# Patient Record
Sex: Male | Born: 1980 | Race: White | Hispanic: No | Marital: Married | Smoking: Current every day smoker
Health system: Southern US, Community
[De-identification: ages and names within clinical notes are randomized; demographics above are authoritative.]

## PROBLEM LIST (undated history)

## (undated) ENCOUNTER — Emergency Department (HOSPITAL_BASED_OUTPATIENT_CLINIC_OR_DEPARTMENT_OTHER): Admission: EM | Payer: Self-pay | Source: Home / Self Care

## (undated) DIAGNOSIS — F1920 Other psychoactive substance dependence, uncomplicated: Secondary | ICD-10-CM

## (undated) DIAGNOSIS — F410 Panic disorder [episodic paroxysmal anxiety] without agoraphobia: Secondary | ICD-10-CM

## (undated) DIAGNOSIS — F419 Anxiety disorder, unspecified: Secondary | ICD-10-CM

## (undated) DIAGNOSIS — M549 Dorsalgia, unspecified: Secondary | ICD-10-CM

## (undated) DIAGNOSIS — G8929 Other chronic pain: Secondary | ICD-10-CM

## (undated) HISTORY — PX: APPENDECTOMY: SHX54

---

## 1999-12-21 ENCOUNTER — Emergency Department (HOSPITAL_COMMUNITY): Admission: EM | Admit: 1999-12-21 | Discharge: 1999-12-21 | Payer: Self-pay | Admitting: Emergency Medicine

## 1999-12-21 ENCOUNTER — Encounter: Payer: Self-pay | Admitting: Emergency Medicine

## 1999-12-29 ENCOUNTER — Emergency Department (HOSPITAL_COMMUNITY): Admission: EM | Admit: 1999-12-29 | Discharge: 1999-12-29 | Payer: Self-pay | Admitting: Emergency Medicine

## 2003-07-08 ENCOUNTER — Emergency Department (HOSPITAL_COMMUNITY): Admission: AD | Admit: 2003-07-08 | Discharge: 2003-07-08 | Payer: Self-pay | Admitting: Family Medicine

## 2005-09-06 ENCOUNTER — Emergency Department (HOSPITAL_COMMUNITY): Admission: EM | Admit: 2005-09-06 | Discharge: 2005-09-07 | Payer: Self-pay | Admitting: Emergency Medicine

## 2008-10-14 ENCOUNTER — Emergency Department (HOSPITAL_COMMUNITY): Admission: EM | Admit: 2008-10-14 | Discharge: 2008-10-14 | Payer: Self-pay | Admitting: Emergency Medicine

## 2009-07-30 ENCOUNTER — Inpatient Hospital Stay (HOSPITAL_COMMUNITY): Admission: EM | Admit: 2009-07-30 | Discharge: 2009-08-12 | Payer: Self-pay | Admitting: Emergency Medicine

## 2009-08-19 ENCOUNTER — Encounter: Admission: RE | Admit: 2009-08-19 | Discharge: 2009-08-19 | Payer: Self-pay | Admitting: Surgery

## 2009-08-31 ENCOUNTER — Ambulatory Visit (HOSPITAL_COMMUNITY): Admission: RE | Admit: 2009-08-31 | Discharge: 2009-08-31 | Payer: Self-pay | Admitting: Surgery

## 2009-08-31 ENCOUNTER — Inpatient Hospital Stay (HOSPITAL_COMMUNITY): Admission: AD | Admit: 2009-08-31 | Discharge: 2009-09-07 | Payer: Self-pay | Admitting: Surgery

## 2009-09-21 ENCOUNTER — Inpatient Hospital Stay (HOSPITAL_COMMUNITY): Admission: RE | Admit: 2009-09-21 | Discharge: 2009-09-25 | Payer: Self-pay | Admitting: Surgery

## 2009-10-05 ENCOUNTER — Encounter: Admission: RE | Admit: 2009-10-05 | Discharge: 2009-10-05 | Payer: Self-pay | Admitting: Surgery

## 2009-10-26 ENCOUNTER — Encounter: Admission: RE | Admit: 2009-10-26 | Discharge: 2009-10-26 | Payer: Self-pay | Admitting: Surgery

## 2010-01-05 ENCOUNTER — Emergency Department (HOSPITAL_COMMUNITY): Admission: EM | Admit: 2010-01-05 | Discharge: 2010-01-06 | Payer: Self-pay | Admitting: Emergency Medicine

## 2010-07-20 LAB — CBC
MCH: 30.8 pg (ref 26.0–34.0)
MCHC: 34.3 g/dL (ref 30.0–36.0)
MCV: 89.7 fL (ref 78.0–100.0)
WBC: 9.3 10*3/uL (ref 4.0–10.5)

## 2010-07-20 LAB — BASIC METABOLIC PANEL
BUN: 14 mg/dL (ref 6–23)
Calcium: 9 mg/dL (ref 8.4–10.5)
Chloride: 106 mEq/L (ref 96–112)
Creatinine, Ser: 0.88 mg/dL (ref 0.4–1.5)
GFR calc non Af Amer: >60 mL/min (ref >60)
Glucose, Bld: 94 mg/dL (ref 70–99)

## 2010-07-20 LAB — DIFFERENTIAL
Basophils Absolute: 0.1 10*3/uL (ref 0.0–0.1)
Basophils Relative: 1 % (ref 0–1)
Eosinophils Absolute: 0.5 10*3/uL (ref 0.0–0.7)
Neutro Abs: 5.6 10*3/uL (ref 1.7–7.7)

## 2010-07-24 LAB — COMPREHENSIVE METABOLIC PANEL
AST: 16 U/L (ref 0–37)
AST: 17 U/L (ref 0–37)
Albumin: 3.1 g/dL — ABNORMAL LOW (ref 3.5–5.2)
Albumin: 3.6 g/dL (ref 3.5–5.2)
Alkaline Phosphatase: 54 U/L (ref 39–117)
BUN: 10 mg/dL (ref 6–23)
CO2: 29 mEq/L (ref 19–32)
CO2: 29 mEq/L (ref 19–32)
Calcium: 8.7 mg/dL (ref 8.4–10.5)
Calcium: 9.3 mg/dL (ref 8.4–10.5)
Chloride: 103 mEq/L (ref 96–112)
Chloride: 106 mEq/L (ref 96–112)
Creatinine, Ser: 0.84 mg/dL (ref 0.4–1.5)
GFR calc Af Amer: >60 mL/min (ref >60)
Glucose, Bld: 146 mg/dL — ABNORMAL HIGH (ref 70–99)
Total Bilirubin: 0.4 mg/dL (ref 0.3–1.2)
Total Protein: 6.5 g/dL (ref 6.0–8.3)

## 2010-07-24 LAB — CBC
HCT: 30.1 % — ABNORMAL LOW (ref 39.0–52.0)
HCT: 35.5 % — ABNORMAL LOW (ref 39.0–52.0)
Hemoglobin: 11.5 g/dL — ABNORMAL LOW (ref 13.0–17.0)
Platelets: 292 10*3/uL (ref 150–400)
RBC: 4.01 MIL/uL — ABNORMAL LOW (ref 4.22–5.81)
RDW: 17.1 % — ABNORMAL HIGH (ref 11.5–15.5)
WBC: 11.7 10*3/uL — ABNORMAL HIGH (ref 4.0–10.5)
WBC: 6.8 10*3/uL (ref 4.0–10.5)

## 2010-07-24 LAB — DIFFERENTIAL
Basophils Relative: 1 % (ref 0–1)
Monocytes Absolute: 0.5 10*3/uL (ref 0.1–1.0)
Neutrophils Relative %: 58 % (ref 43–77)

## 2010-07-25 LAB — ANAEROBIC CULTURE

## 2010-07-25 LAB — BODY FLUID CULTURE

## 2010-07-25 LAB — CBC
HCT: 26.5 % — ABNORMAL LOW (ref 39.0–52.0)
HCT: 27.2 % — ABNORMAL LOW (ref 39.0–52.0)
HCT: 28.5 % — ABNORMAL LOW (ref 39.0–52.0)
HCT: 29.9 % — ABNORMAL LOW (ref 39.0–52.0)
Hemoglobin: 8.8 g/dL — ABNORMAL LOW (ref 13.0–17.0)
Hemoglobin: 9 g/dL — ABNORMAL LOW (ref 13.0–17.0)
Hemoglobin: 9.7 g/dL — ABNORMAL LOW (ref 13.0–17.0)
MCHC: 33.2 g/dL (ref 30.0–36.0)
MCHC: 33.8 g/dL (ref 30.0–36.0)
MCV: 87.4 fL (ref 78.0–100.0)
MCV: 87.6 fL (ref 78.0–100.0)
MCV: 87.7 fL (ref 78.0–100.0)
MCV: 87.6 fL (ref 78.0–100.0)
Platelets: 496 10*3/uL — ABNORMAL HIGH (ref 150–400)
Platelets: 420 10*3/uL — ABNORMAL HIGH (ref 150–400)
Platelets: 461 10*3/uL — ABNORMAL HIGH (ref 150–400)
RBC: 3.04 MIL/uL — ABNORMAL LOW (ref 4.22–5.81)
RBC: 3.52 MIL/uL — ABNORMAL LOW (ref 4.22–5.81)
RDW: 15.2 % (ref 11.5–15.5)
RDW: 15.3 % (ref 11.5–15.5)
RDW: 15.4 % (ref 11.5–15.5)
WBC: 8.8 10*3/uL (ref 4.0–10.5)
WBC: 8.8 10*3/uL (ref 4.0–10.5)
WBC: 14.2 10*3/uL — ABNORMAL HIGH (ref 4.0–10.5)

## 2010-07-25 LAB — CULTURE, BLOOD (ROUTINE X 2): Culture: NO GROWTH

## 2010-07-25 LAB — BASIC METABOLIC PANEL
BUN: 3 mg/dL — ABNORMAL LOW (ref 6–23)
CO2: 27 mEq/L (ref 19–32)
GFR calc non Af Amer: >60 mL/min (ref >60)
Glucose, Bld: 131 mg/dL — ABNORMAL HIGH (ref 70–99)
Potassium: 4.4 mEq/L (ref 3.5–5.1)
Sodium: 134 mEq/L — ABNORMAL LOW (ref 135–145)

## 2010-07-25 LAB — CULTURE, ROUTINE-ABSCESS

## 2010-07-26 LAB — CBC
HCT: 35 % — ABNORMAL LOW (ref 39.0–52.0)
HCT: 35.7 % — ABNORMAL LOW (ref 39.0–52.0)
HCT: 37.8 % — ABNORMAL LOW (ref 39.0–52.0)
Hemoglobin: 11 g/dL — ABNORMAL LOW (ref 13.0–17.0)
Hemoglobin: 11.9 g/dL — ABNORMAL LOW (ref 13.0–17.0)
Hemoglobin: 12 g/dL — ABNORMAL LOW (ref 13.0–17.0)
Hemoglobin: 12.2 g/dL — ABNORMAL LOW (ref 13.0–17.0)
MCHC: 33.4 g/dL (ref 30.0–36.0)
MCHC: 33.8 g/dL (ref 30.0–36.0)
MCHC: 34 g/dL (ref 30.0–36.0)
MCHC: 34.2 g/dL (ref 30.0–36.0)
MCV: 91.1 fL (ref 78.0–100.0)
MCV: 91.3 fL (ref 78.0–100.0)
MCV: 91.5 fL (ref 78.0–100.0)
MCV: 92.7 fL (ref 78.0–100.0)
Platelets: 659 10*3/uL — ABNORMAL HIGH (ref 150–400)
Platelets: 718 10*3/uL — ABNORMAL HIGH (ref 150–400)
Platelets: 781 10*3/uL — ABNORMAL HIGH (ref 150–400)
RBC: 3.56 MIL/uL — ABNORMAL LOW (ref 4.22–5.81)
RBC: 3.72 MIL/uL — ABNORMAL LOW (ref 4.22–5.81)
RBC: 3.81 MIL/uL — ABNORMAL LOW (ref 4.22–5.81)
RBC: 3.83 MIL/uL — ABNORMAL LOW (ref 4.22–5.81)
RBC: 3.91 MIL/uL — ABNORMAL LOW (ref 4.22–5.81)
RDW: 14.3 % (ref 11.5–15.5)
RDW: 14.4 % (ref 11.5–15.5)
RDW: 14.9 % (ref 11.5–15.5)
WBC: 14.8 10*3/uL — ABNORMAL HIGH (ref 4.0–10.5)
WBC: 15.4 10*3/uL — ABNORMAL HIGH (ref 4.0–10.5)
WBC: 17.4 10*3/uL — ABNORMAL HIGH (ref 4.0–10.5)

## 2010-07-26 LAB — URINALYSIS, ROUTINE W REFLEX MICROSCOPIC
Glucose, UA: NEGATIVE mg/dL
Ketones, ur: NEGATIVE mg/dL
Protein, ur: NEGATIVE mg/dL
Urobilinogen, UA: 0.2 mg/dL (ref 0.0–1.0)

## 2010-07-26 LAB — DIFFERENTIAL
Basophils Absolute: 0 10*3/uL (ref 0.0–0.1)
Basophils Absolute: 0.1 10*3/uL (ref 0.0–0.1)
Basophils Relative: 1 % (ref 0–1)
Eosinophils Absolute: 0.5 10*3/uL (ref 0.0–0.7)
Eosinophils Absolute: 0.5 10*3/uL (ref 0.0–0.7)
Eosinophils Relative: 3 % (ref 0–5)
Eosinophils Relative: 4 % (ref 0–5)
Lymphocytes Relative: 13 % (ref 12–46)
Lymphocytes Relative: 14 % (ref 12–46)
Lymphs Abs: 2 10*3/uL (ref 0.7–4.0)
Monocytes Absolute: 2.1 10*3/uL — ABNORMAL HIGH (ref 0.1–1.0)
Neutro Abs: 10.1 10*3/uL — ABNORMAL HIGH (ref 1.7–7.7)
Neutrophils Relative %: 64 % (ref 43–77)

## 2010-07-26 LAB — ANAEROBIC CULTURE

## 2010-07-26 LAB — BODY FLUID CULTURE: Culture: NO GROWTH

## 2010-07-26 LAB — BASIC METABOLIC PANEL
BUN: 8 mg/dL (ref 6–23)
CO2: 26 mEq/L (ref 19–32)
Chloride: 104 mEq/L (ref 96–112)
Creatinine, Ser: 1.02 mg/dL (ref 0.4–1.5)
Glucose, Bld: 116 mg/dL — ABNORMAL HIGH (ref 70–99)

## 2010-07-26 LAB — CULTURE, ROUTINE-ABSCESS

## 2010-07-26 LAB — APTT: aPTT: 29 seconds (ref 24–37)

## 2010-07-30 LAB — CBC
HCT: 33.2 % — ABNORMAL LOW (ref 39.0–52.0)
HCT: 34.1 % — ABNORMAL LOW (ref 39.0–52.0)
HCT: 34.7 % — ABNORMAL LOW (ref 39.0–52.0)
Hemoglobin: 11.7 g/dL — ABNORMAL LOW (ref 13.0–17.0)
Hemoglobin: 12 g/dL — ABNORMAL LOW (ref 13.0–17.0)
MCHC: 33.8 g/dL (ref 30.0–36.0)
MCHC: 33.9 g/dL (ref 30.0–36.0)
MCHC: 35 g/dL (ref 30.0–36.0)
MCV: 91.2 fL (ref 78.0–100.0)
MCV: 91.7 fL (ref 78.0–100.0)
MCV: 91.7 fL (ref 78.0–100.0)
MCV: 92.6 fL (ref 78.0–100.0)
Platelets: 233 10*3/uL (ref 150–400)
Platelets: 308 10*3/uL (ref 150–400)
RBC: 3.72 MIL/uL — ABNORMAL LOW (ref 4.22–5.81)
RBC: 3.96 MIL/uL — ABNORMAL LOW (ref 4.22–5.81)
RBC: 4.98 MIL/uL (ref 4.22–5.81)
RDW: 13.4 % (ref 11.5–15.5)
RDW: 14.4 % (ref 11.5–15.5)
WBC: 10.9 10*3/uL — ABNORMAL HIGH (ref 4.0–10.5)
WBC: 12.6 10*3/uL — ABNORMAL HIGH (ref 4.0–10.5)
WBC: 8.9 10*3/uL (ref 4.0–10.5)

## 2010-07-30 LAB — DIFFERENTIAL
Basophils Absolute: 0.1 10*3/uL (ref 0.0–0.1)
Basophils Relative: 0 % (ref 0–1)
Basophils Relative: 0 % (ref 0–1)
Basophils Relative: 1 % (ref 0–1)
Eosinophils Absolute: 0.5 10*3/uL (ref 0.0–0.7)
Eosinophils Relative: 0 % (ref 0–5)
Lymphocytes Relative: 3 % — ABNORMAL LOW (ref 12–46)
Monocytes Absolute: 0.1 10*3/uL (ref 0.1–1.0)
Monocytes Absolute: 1.9 10*3/uL — ABNORMAL HIGH (ref 0.1–1.0)
Monocytes Relative: 1 % — ABNORMAL LOW (ref 3–12)
Monocytes Relative: 17 % — ABNORMAL HIGH (ref 3–12)
Neutro Abs: 9 10*3/uL — ABNORMAL HIGH (ref 1.7–7.7)
Neutrophils Relative %: 65 % (ref 43–77)
Neutrophils Relative %: 69 % (ref 43–77)
Neutrophils Relative %: 96 % — ABNORMAL HIGH (ref 43–77)

## 2010-07-30 LAB — GLUCOSE, CAPILLARY
Glucose-Capillary: 107 mg/dL — ABNORMAL HIGH (ref 70–99)
Glucose-Capillary: 113 mg/dL — ABNORMAL HIGH (ref 70–99)
Glucose-Capillary: 115 mg/dL — ABNORMAL HIGH (ref 70–99)
Glucose-Capillary: 124 mg/dL — ABNORMAL HIGH (ref 70–99)
Glucose-Capillary: 127 mg/dL — ABNORMAL HIGH (ref 70–99)
Glucose-Capillary: 157 mg/dL — ABNORMAL HIGH (ref 70–99)
Glucose-Capillary: 163 mg/dL — ABNORMAL HIGH (ref 70–99)
Glucose-Capillary: 180 mg/dL — ABNORMAL HIGH (ref 70–99)
Glucose-Capillary: 76 mg/dL (ref 70–99)
Glucose-Capillary: 97 mg/dL (ref 70–99)

## 2010-07-30 LAB — BASIC METABOLIC PANEL
BUN: 11 mg/dL (ref 6–23)
CO2: 28 mEq/L (ref 19–32)
Calcium: 9.4 mg/dL (ref 8.4–10.5)
Chloride: 100 mEq/L (ref 96–112)
Chloride: 104 mEq/L (ref 96–112)
Chloride: 97 mEq/L (ref 96–112)
Creatinine, Ser: 0.85 mg/dL (ref 0.4–1.5)
Creatinine, Ser: 1.29 mg/dL (ref 0.4–1.5)
GFR calc Af Amer: >60 mL/min (ref >60)
GFR calc Af Amer: >60 mL/min (ref >60)
GFR calc non Af Amer: >60 mL/min (ref >60)
GFR calc non Af Amer: >60 mL/min (ref >60)
Glucose, Bld: 117 mg/dL — ABNORMAL HIGH (ref 70–99)
Potassium: 3.9 mEq/L (ref 3.5–5.1)
Sodium: 136 mEq/L (ref 135–145)

## 2010-07-30 LAB — HEMOGLOBIN A1C
Hgb A1c MFr Bld: 5.4 % (ref 4.6–6.1)
Mean Plasma Glucose: 108 mg/dL

## 2011-03-11 ENCOUNTER — Emergency Department (HOSPITAL_COMMUNITY)
Admission: EM | Admit: 2011-03-11 | Discharge: 2011-03-11 | Disposition: A | Discharge status: Home / Self Care | Payer: Self-pay | Attending: Emergency Medicine | Admitting: Emergency Medicine

## 2011-03-11 DIAGNOSIS — G8929 Other chronic pain: Secondary | ICD-10-CM | POA: Insufficient documentation

## 2011-03-11 DIAGNOSIS — M549 Dorsalgia, unspecified: Secondary | ICD-10-CM | POA: Insufficient documentation

## 2011-03-11 DIAGNOSIS — R059 Cough, unspecified: Secondary | ICD-10-CM | POA: Insufficient documentation

## 2011-03-11 DIAGNOSIS — M545 Low back pain, unspecified: Secondary | ICD-10-CM | POA: Insufficient documentation

## 2011-03-11 DIAGNOSIS — R05 Cough: Secondary | ICD-10-CM | POA: Insufficient documentation

## 2011-03-11 HISTORY — DX: Other chronic pain: G89.29

## 2011-03-11 HISTORY — DX: Dorsalgia, unspecified: M54.9

## 2011-03-11 MED ORDER — IBUPROFEN 800 MG PO TABS: 800.0000 mg | ORAL_TABLET | Freq: Three times a day (TID) | ORAL | Status: AC

## 2011-03-11 MED ORDER — DIAZEPAM 5 MG PO TABS: 5.0000 mg | ORAL_TABLET | Freq: Two times a day (BID) | ORAL | Status: AC

## 2011-03-11 MED ORDER — OXYCODONE-ACETAMINOPHEN 5-325 MG PO TABS: 1.0000 | ORAL_TABLET | Freq: Once | ORAL | Status: DC

## 2011-03-11 MED ORDER — DIAZEPAM 5 MG PO TABS
ORAL_TABLET | ORAL | Status: AC
  Filled 2011-03-11: qty 1

## 2011-03-11 MED ORDER — KETOROLAC TROMETHAMINE 60 MG/2ML IM SOLN
INTRAMUSCULAR | Status: AC
  Filled 2011-03-11: qty 2

## 2011-03-11 MED ORDER — OXYCODONE-ACETAMINOPHEN 5-325 MG PO TABS: 1.0000 | ORAL_TABLET | ORAL | Status: AC | PRN

## 2011-03-11 MED ORDER — KETOROLAC TROMETHAMINE 60 MG/2ML IM SOLN
60.0000 mg | Freq: Once | INTRAMUSCULAR | Status: AC
  Administered 2011-03-11: 60 mg via INTRAMUSCULAR

## 2011-03-11 MED ORDER — DIAZEPAM 5 MG PO TABS
5.0000 mg | ORAL_TABLET | Freq: Once | ORAL | Status: AC
  Administered 2011-03-11: 5 mg via ORAL
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
## 2011-03-12 NOTE — ED Provider Notes (Signed)
Medical screening examination/treatment/procedure(s) were performed by non-physician practitioner and as supervising physician I was immediately available for consultation/collaboration.   Sierra Bissonette A Anwitha Mapes, MD 03/12/11 0250 

## 2011-03-31 IMAGING — CT CT ABCESS DRAINAGE
2 of 15 series · 9 of 32 positions shown, 14 images · non-contrast
Comparison: CT of the abdomen and pelvis dated 08/04/2009

CLINICAL DATA: One week status post appendectomy.  Fever and
abdominal pain.  CT has demonstrated separate fluid collections in
the right lower quadrant and deep pelvis.

1.  CT GUIDED DRAINAGE OF RIGHT LOWER QUADRANT PERITONEAL ABSCESS
2.  CT-GUIDED DRAINAGE OF PELVIC PERITONEAL ABSCESS

[Series 2: rtn ap with st · axial · 0.85mm/px · z∈[-92,-32]mm · 4 of 22 slices shown]
[im 5/22  soft-tissue]
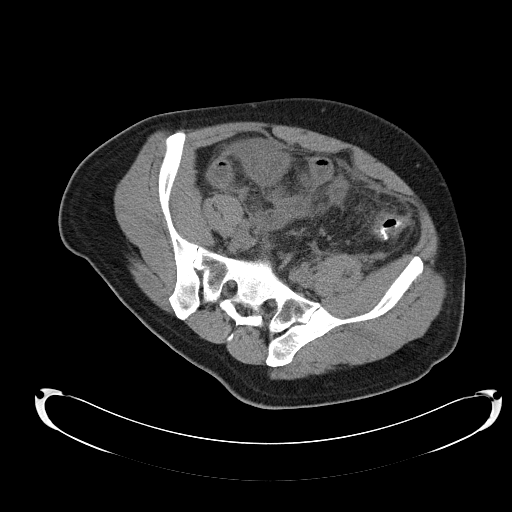
[im 9/22  soft-tissue]
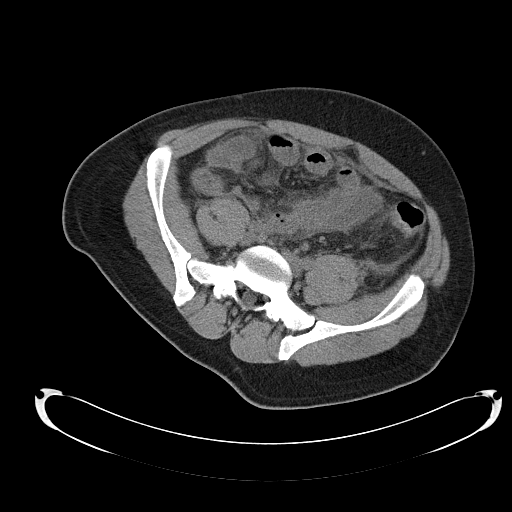
[im 13/22  soft-tissue]
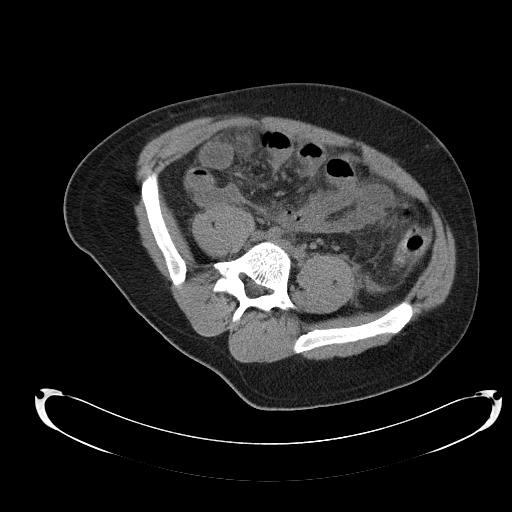
[im 17/22  soft-tissue]
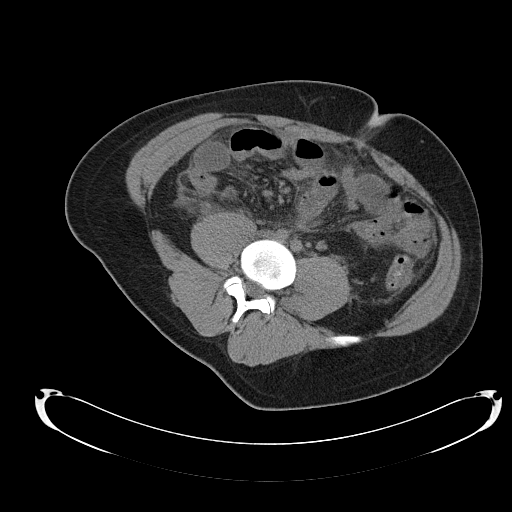

[Series 9: prone pelvi st · axial · 0.87mm/px · z∈[-162,-58]mm · 5 of 33 slices shown, 10 images]
[im 6/33  soft-tissue]
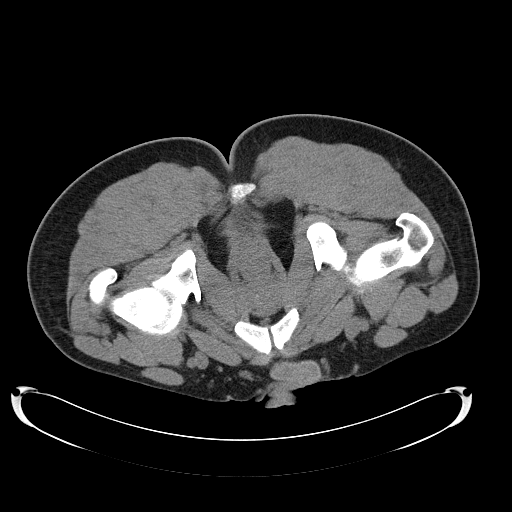
[im 6/33  bone]
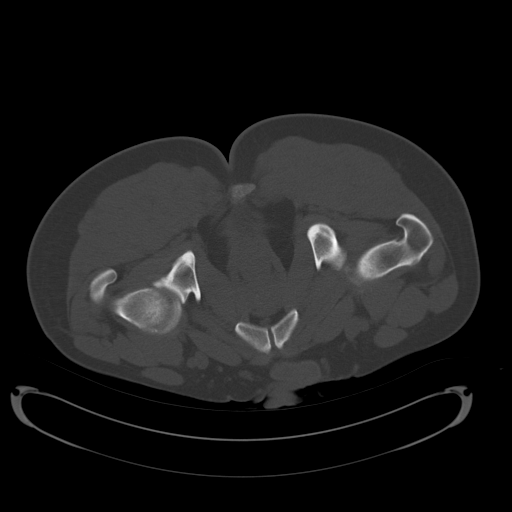
[im 11/33  soft-tissue]
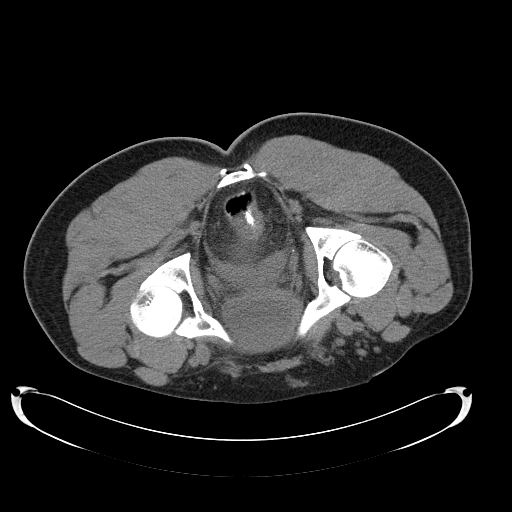
[im 11/33  lung]
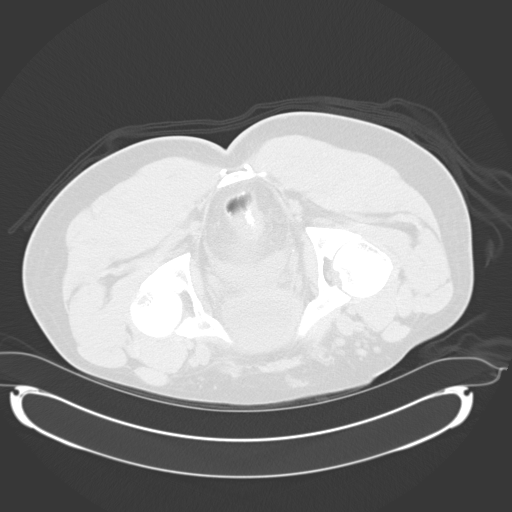
[im 17/33  soft-tissue]
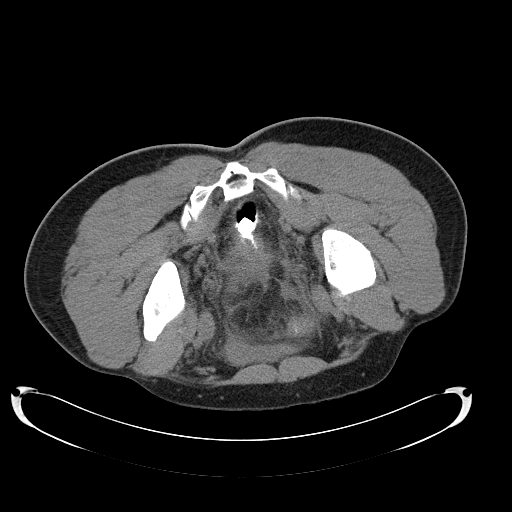
[im 17/33  lung]
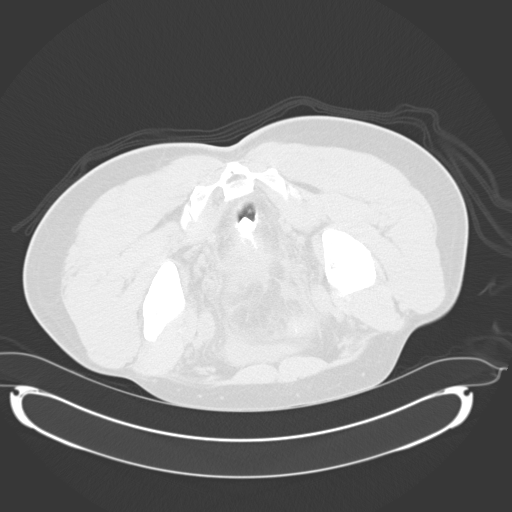
[im 22/33  soft-tissue]
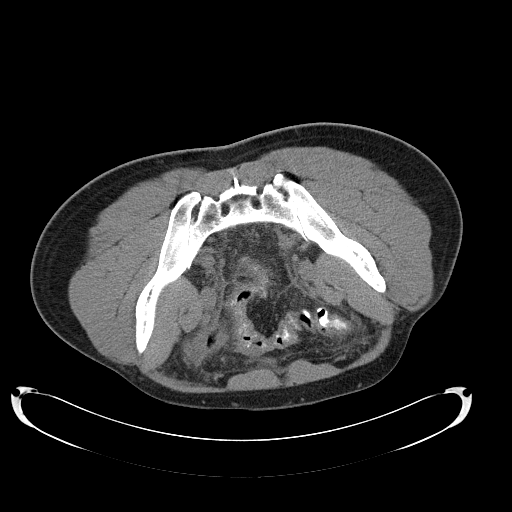
[im 22/33  lung]
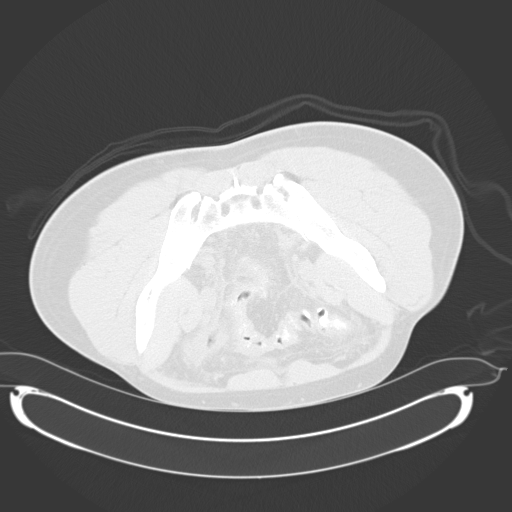
[im 27/33  soft-tissue]
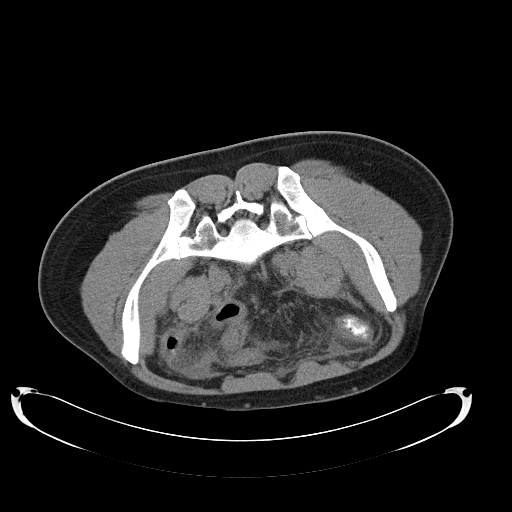
[im 27/33  lung]
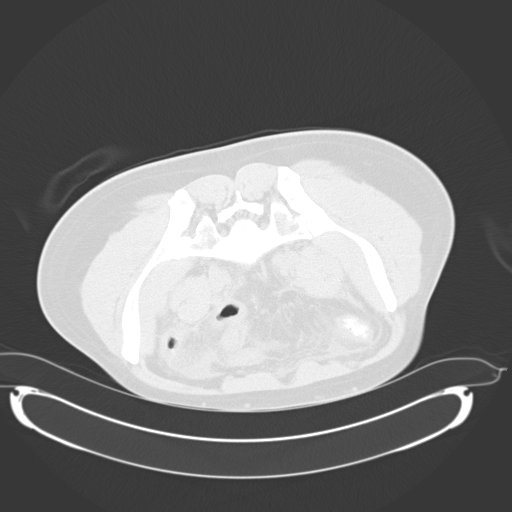

[9 of 32 positions shown; findings below may reference images not displayed]

Sedation: Versed 12.0 mg IV, Fentanyl 300 mcg IV

Total Moderate Sedation Time: 75 minutes.

Procedure:  The procedure, risks, benefits, and alternatives were
explained to the patient.  Questions regarding the procedure were
encouraged and answered. The patient understands and consents to
the procedure.

The right abdominal wall and right gluteal region were prepped with
betadine in a sterile fashion, and a sterile drape was applied
covering the operative field.  A sterile gown and sterile gloves
were used for the procedure. Local anesthesia was provided with 1%
Lidocaine.

Initial supine imaging of the abdomen was performed.  The patient
was rolled slightly with the right side up.  From a lateral
approach, an 18 gauge trocar needle was advanced to the level of a
pericecal, right lower quadrant fluid collection.  The collection
was drained via the needle.  Fluid sample was sent for culture
studies.

The patient was placed in a prone position.  CT was performed
through the pelvis.  From a right transgluteal approach, an 18
gauge trocar needle was advanced to the level of the deep pelvic
fluid collection anterior to the rectum.  Aspiration was performed.
A fluid sample was sent for culture studies.  A guidewire was
advanced into the collection.  The tract was dilated to 10-French
and a 10-French percutaneous drain placed.  Catheter position was
confirmed by CT.  The catheter was flushed with saline and
connected to a suction bulb.  It was secured at the skin with a
Prolene retention suture and Stat-Lock device.

Complications: None
FINDINGS: Aspiration initially of the pericecal right lower
quadrant abscess fluid collection yielded 5 ml of dark, old blood.
Findings are consistent with liquefied hematoma.  The fluid was not
purulent.

Aspiration of the deep pelvic fluid collection yielded grossly
purulent fluid.  A drain was therefore placed.  The drain was
successfully positioned in the collection.  This will be left to
suction bulb drainage.
IMPRESSION: 1.  Needle drainage of right lower quadrant fluid collection
yielded 5 ml of dark, bloody fluid.
2.  The pelvic abscess yielded grossly purulent fluid.  A 10-French
drain was placed and connected to a suction bulb.  Output will be
followed.

## 2011-06-03 ENCOUNTER — Encounter (HOSPITAL_COMMUNITY): Payer: Self-pay

## 2011-06-03 ENCOUNTER — Emergency Department (HOSPITAL_COMMUNITY)
Admission: EM | Admit: 2011-06-03 | Discharge: 2011-06-03 | Disposition: A | Discharge status: Home / Self Care | Payer: Managed Care, Other (non HMO) | Attending: Emergency Medicine | Admitting: Emergency Medicine

## 2011-06-03 ENCOUNTER — Emergency Department (HOSPITAL_COMMUNITY): Payer: Managed Care, Other (non HMO)

## 2011-06-03 DIAGNOSIS — M79609 Pain in unspecified limb: Secondary | ICD-10-CM | POA: Insufficient documentation

## 2011-06-03 DIAGNOSIS — R2230 Localized swelling, mass and lump, unspecified upper limb: Secondary | ICD-10-CM

## 2011-06-03 DIAGNOSIS — R229 Localized swelling, mass and lump, unspecified: Secondary | ICD-10-CM | POA: Insufficient documentation

## 2011-06-03 MED ORDER — HYDROCODONE-ACETAMINOPHEN 5-325 MG PO TABS: 1.0000 | ORAL_TABLET | ORAL | Status: AC | PRN

## 2011-06-03 MED ORDER — OXYCODONE-ACETAMINOPHEN 5-325 MG PO TABS
1.0000 | ORAL_TABLET | Freq: Once | ORAL | Status: AC
  Administered 2011-06-03: 1 via ORAL
  Filled 2011-06-03: qty 1

## 2011-06-03 NOTE — ED Provider Notes (Signed)
History     CSN: [id number]  Arrival date & time 06/03/11  2018   First MD Initiated Contact with Patient 06/03/11 2026      Chief Complaint  Patient presents with  . Hand Pain    (Consider location/radiation/quality/duration/timing/severity/associated sxs/prior treatment) HPI Comments: Patient reports that he has had a bump at the base of his left 3rd digit for over a month.  He reports that he has some pain associated with the nodule.  He is able to move his fingers.  He denies any numbness or tingling.  He denies any erythema or warmth to the area.  No acute injury to the area.  He has never had anything like this before.  Patient is a 31 y.o. male presenting with hand pain. The history is provided by the patient.  Hand Pain Pertinent negatives include no chills, fever, joint swelling, nausea, numbness, rash, vomiting or weakness. He has tried NSAIDs for the symptoms.    Past Medical History  Diagnosis Date  . Chronic back pain     Past Surgical History  Procedure Date  . Appendectomy     No family history on file.  History  Substance Use Topics  . Smoking status: Current Some Day Smoker  . Smokeless tobacco: Not on file  . Alcohol Use: Yes      Review of Systems  Constitutional: Negative for fever and chills.  Gastrointestinal: Negative for nausea and vomiting.  Musculoskeletal: Negative for joint swelling.  Skin: Negative for color change and rash.  Neurological: Negative for weakness and numbness.    Allergies  Ciprofloxacin hcl and Flagyl  Home Medications   Current Outpatient Rx  Name Route Sig Dispense Refill  . IBUPROFEN 200 MG PO TABS Oral Take 600 mg by mouth every 6 (six) hours as needed. pain    . HYDROCODONE-ACETAMINOPHEN 5-325 MG PO TABS Oral Take 1 tablet by mouth every 4 (four) hours as needed for pain. 10 tablet 0    BP 110/64  Pulse 66  Temp(Src) 98 F (36.7 C) (Oral)  Resp 18  Wt 178 lb (80.74 kg)  SpO2 99%  Physical Exam    Nursing note and vitals reviewed. Constitutional: He is oriented to person, place, and time. He appears well-developed and well-nourished. No distress.  HENT:  Head: Normocephalic and atraumatic.  Neck: Normal range of motion. Neck supple.  Cardiovascular: Normal rate and normal heart sounds.   Pulmonary/Chest: Effort normal and breath sounds normal.  Musculoskeletal:       Pea sized nodule at the base of the left 3rd digit on the palmer surface.  No overlying erythema or warmth.  Nodule not fluctuant.   Patient able to move finger without difficulty.  Neurological: He is alert and oriented to person, place, and time.  Skin: Skin is warm and dry. He is not diaphoretic.  Psychiatric: He has a normal mood and affect.    ED Course  Procedures (including critical care time)  Labs Reviewed - No data to display Dg Hand Complete Left  06/03/2011  *RADIOLOGY REPORT*  Clinical Data: Cysts at middle finger  LEFT HAND - COMPLETE 3+ VIEW  Comparison: None  Findings: Bone mineralization normal. Joint spaces preserved. No fracture, dislocation, or bone destruction. No definite soft tissue abnormalities seen.  IMPRESSION: Normal exam.  Original Report Authenticated By: Lollie Marrow, M.D.     1. Nodule of finger       MDM  Patient has had nodule for over a month.  No overlying erythema or warmth.  Nodule non fluctuant.  Therefore, do not feel that further interventions needs to be done at this time.  Patient given referral to Hand doctor.        Pascal Lux [place], PA-C 06/04/11 [phone]

## 2011-06-03 NOTE — ED Notes (Signed)
Rt hand -middle finger pain- unable to grip - denies injury- pt reports has been hurting for a while just increased pain over the last several weeks- pt reports motrin with no relief

## 2011-06-04 NOTE — ED Provider Notes (Signed)
Medical screening examination/treatment/procedure(s) were performed by non-physician practitioner and as supervising physician I was immediately available for consultation/collaboration.    Nelia Shi, MD 06/04/11 [phone]

## 2011-06-29 ENCOUNTER — Inpatient Hospital Stay (HOSPITAL_COMMUNITY)
Admission: AD | Admit: 2011-06-29 | Payer: Managed Care, Other (non HMO) | Source: Ambulatory Visit | Admitting: Psychiatry

## 2011-06-29 ENCOUNTER — Emergency Department (HOSPITAL_COMMUNITY)
Admission: EM | Admit: 2011-06-29 | Discharge: 2011-06-29 | Disposition: A | Discharge status: Home / Self Care | Payer: Managed Care, Other (non HMO) | Attending: Emergency Medicine | Admitting: Emergency Medicine

## 2011-06-29 ENCOUNTER — Encounter (HOSPITAL_COMMUNITY): Payer: Self-pay | Admitting: *Deleted

## 2011-06-29 DIAGNOSIS — F1120 Opioid dependence, uncomplicated: Secondary | ICD-10-CM

## 2011-06-29 DIAGNOSIS — M549 Dorsalgia, unspecified: Secondary | ICD-10-CM | POA: Insufficient documentation

## 2011-06-29 DIAGNOSIS — G8929 Other chronic pain: Secondary | ICD-10-CM | POA: Insufficient documentation

## 2011-06-29 DIAGNOSIS — F17200 Nicotine dependence, unspecified, uncomplicated: Secondary | ICD-10-CM | POA: Insufficient documentation

## 2011-06-29 HISTORY — DX: Other psychoactive substance dependence, uncomplicated: F19.20

## 2011-06-29 LAB — CBC
Hemoglobin: 12.7 g/dL — ABNORMAL LOW (ref 13.0–17.0)
RBC: 4.28 MIL/uL (ref 4.22–5.81)

## 2011-06-29 LAB — RAPID URINE DRUG SCREEN, HOSP PERFORMED
Barbiturates: NOT DETECTED
Benzodiazepines: POSITIVE — AB
Tetrahydrocannabinol: POSITIVE — AB

## 2011-06-29 LAB — COMPREHENSIVE METABOLIC PANEL
ALT: 15 U/L (ref 0–53)
Alkaline Phosphatase: 45 U/L (ref 39–117)
CO2: 27 mEq/L (ref 19–32)
GFR calc Af Amer: >90 mL/min (ref >90)
GFR calc non Af Amer: >90 mL/min (ref >90)
Glucose, Bld: 80 mg/dL (ref 70–99)
Potassium: 4.1 mEq/L (ref 3.5–5.1)
Sodium: 142 mEq/L (ref 135–145)
Total Protein: 7 g/dL (ref 6.0–8.3)

## 2011-06-29 MED ORDER — ONDANSETRON HCL 4 MG PO TABS: 4.0000 mg | ORAL_TABLET | Freq: Three times a day (TID) | ORAL | Status: DC | PRN

## 2011-06-29 MED ORDER — CLONIDINE HCL 0.1 MG PO TABS: 0.1000 mg | ORAL_TABLET | ORAL | Status: DC

## 2011-06-29 MED ORDER — LORAZEPAM 1 MG PO TABS
1.0000 mg | ORAL_TABLET | Freq: Three times a day (TID) | ORAL | Status: DC | PRN
  Administered 2011-06-29 (×2): 1 mg via ORAL
  Filled 2011-06-29 (×2): qty 1

## 2011-06-29 MED ORDER — CLONIDINE HCL 0.1 MG PO TABS: 0.1000 mg | ORAL_TABLET | Freq: Four times a day (QID) | ORAL | Status: DC

## 2011-06-29 MED ORDER — HYDROXYZINE HCL 25 MG PO TABS
25.0000 mg | ORAL_TABLET | Freq: Four times a day (QID) | ORAL | Status: DC | PRN
  Administered 2011-06-29: 25 mg via ORAL
  Filled 2011-06-29: qty 1

## 2011-06-29 MED ORDER — CLONIDINE HCL 0.1 MG PO TABS: 0.1000 mg | ORAL_TABLET | Freq: Every day | ORAL | Status: DC

## 2011-06-29 MED ORDER — LOPERAMIDE HCL 2 MG PO CAPS: 2.0000 mg | ORAL_CAPSULE | ORAL | Status: DC | PRN

## 2011-06-29 MED ORDER — METHOCARBAMOL 500 MG PO TABS
500.0000 mg | ORAL_TABLET | Freq: Three times a day (TID) | ORAL | Status: DC | PRN
  Administered 2011-06-29: 500 mg via ORAL
  Filled 2011-06-29: qty 1

## 2011-06-29 MED ORDER — ALUM & MAG HYDROXIDE-SIMETH 200-200-20 MG/5ML PO SUSP: 30.0000 mL | ORAL | Status: DC | PRN

## 2011-06-29 MED ORDER — ZOLPIDEM TARTRATE 5 MG PO TABS
ORAL_TABLET | ORAL | Status: AC
  Filled 2011-06-29: qty 2

## 2011-06-29 MED ORDER — IBUPROFEN 200 MG PO TABS
600.0000 mg | ORAL_TABLET | Freq: Three times a day (TID) | ORAL | Status: DC | PRN
  Administered 2011-06-29: 600 mg via ORAL
  Filled 2011-06-29: qty 3

## 2011-06-29 MED ORDER — DICYCLOMINE HCL 20 MG PO TABS
20.0000 mg | ORAL_TABLET | ORAL | Status: DC | PRN
  Administered 2011-06-29: 20 mg via ORAL
  Filled 2011-06-29: qty 1

## 2011-06-29 MED ORDER — ZOLPIDEM TARTRATE 5 MG PO TABS
10.0000 mg | ORAL_TABLET | Freq: Every evening | ORAL | Status: DC | PRN
  Administered 2011-06-29: 10 mg via ORAL

## 2011-06-29 MED ORDER — ACETAMINOPHEN 325 MG PO TABS: 650.0000 mg | ORAL_TABLET | ORAL | Status: DC | PRN

## 2011-06-29 NOTE — ED Notes (Signed)
Pt reports doing heroine for 6 months. He uses 4 to 5 bags of heroine each day.  Pt reports last dose of heroine was three days ago.  Pt was previously injecting and has weaned himself down to eating and snorting. Last ingestion was three days ago.  Pt also eating 15 to 20 30mg  oxycodone a day to help with withdrawal. Pt reports last ingestion was yesterday.  Pt has had two half mg morphine pills and half a mg of xanax today. Pt reports using xanax heavily over the past three to four moths taking 2.5mg  a day.  Pt reports stomach and generalized body aches.

## 2011-06-29 NOTE — ED Notes (Signed)
Pt denies SI/HI 

## 2011-06-29 NOTE — ED Notes (Signed)
Vital signs stable. 

## 2011-06-29 NOTE — ED Notes (Signed)
Patient denies pain and is resting comfortably.  
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
## 2011-06-29 NOTE — ED Notes (Signed)
-    [facility] Team still working on getting pt placed with possibly [facility].      Pt / Dr. Radford Pax aware.

## 2011-06-29 NOTE — ED Notes (Signed)
Patient requesting to go home d/t long wait.  Patient is alert, oriented and ambulatory.  Offers no c/o other than generalized body aches.
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
## 2011-06-29 NOTE — ED Notes (Signed)
Patient came to ED for help with withdrawal symptoms. Patient states he has been addicted to pills since 2004 (Oxycodone/Xanax) and using Heroin for approx. 6 months now. Reports last Heroin use was 2-3 days ago and states he took Oxycodone last night. Currently, patient c/o nausea, generalized body aches, bloody diarrhea x 2 days.

## 2011-06-29 NOTE — ED Notes (Signed)
Pt changed into blue paper scrubs and wanded by security. 

## 2011-06-29 NOTE — ED Notes (Signed)
Patient is resting comfortably. 

## 2011-08-16 ENCOUNTER — Encounter (HOSPITAL_COMMUNITY): Payer: Self-pay | Admitting: Emergency Medicine

## 2011-08-16 ENCOUNTER — Emergency Department (HOSPITAL_COMMUNITY)
Admission: EM | Admit: 2011-08-16 | Discharge: 2011-08-16 | Disposition: A | Discharge status: Home / Self Care | Payer: Self-pay | Attending: Emergency Medicine | Admitting: Emergency Medicine

## 2011-08-16 DIAGNOSIS — R5383 Other fatigue: Secondary | ICD-10-CM | POA: Insufficient documentation

## 2011-08-16 DIAGNOSIS — T401X4A Poisoning by heroin, undetermined, initial encounter: Secondary | ICD-10-CM | POA: Insufficient documentation

## 2011-08-16 DIAGNOSIS — F1110 Opioid abuse, uncomplicated: Secondary | ICD-10-CM

## 2011-08-16 DIAGNOSIS — T424X1A Poisoning by benzodiazepines, accidental (unintentional), initial encounter: Secondary | ICD-10-CM | POA: Insufficient documentation

## 2011-08-16 DIAGNOSIS — R5381 Other malaise: Secondary | ICD-10-CM | POA: Insufficient documentation

## 2011-08-16 DIAGNOSIS — T401X1A Poisoning by heroin, accidental (unintentional), initial encounter: Secondary | ICD-10-CM | POA: Insufficient documentation

## 2011-08-16 DIAGNOSIS — T424X4A Poisoning by benzodiazepines, undetermined, initial encounter: Secondary | ICD-10-CM | POA: Insufficient documentation

## 2011-08-16 DIAGNOSIS — F1310 Sedative, hypnotic or anxiolytic abuse, uncomplicated: Secondary | ICD-10-CM

## 2011-08-16 LAB — DIFFERENTIAL
Eosinophils Relative: 0 % (ref 0–5)
Lymphocytes Relative: 6 % — ABNORMAL LOW (ref 12–46)
Lymphs Abs: 0.9 10*3/uL (ref 0.7–4.0)
Monocytes Relative: 6 % (ref 3–12)

## 2011-08-16 LAB — COMPREHENSIVE METABOLIC PANEL
ALT: 12 U/L (ref 0–53)
Alkaline Phosphatase: 61 U/L (ref 39–117)
BUN: 13 mg/dL (ref 6–23)
CO2: 29 mEq/L (ref 19–32)
Calcium: 9.1 mg/dL (ref 8.4–10.5)
GFR calc Af Amer: >90 mL/min (ref >90)
GFR calc non Af Amer: >90 mL/min (ref >90)
Glucose, Bld: 148 mg/dL — ABNORMAL HIGH (ref 70–99)
Sodium: 138 mEq/L (ref 135–145)

## 2011-08-16 LAB — CBC
HCT: 40.1 % (ref 39.0–52.0)
Hemoglobin: 12.9 g/dL — ABNORMAL LOW (ref 13.0–17.0)
MCV: 93.5 fL (ref 78.0–100.0)
Platelets: 264 10*3/uL (ref 150–400)
RBC: 4.29 MIL/uL (ref 4.22–5.81)
WBC: 14.3 10*3/uL — ABNORMAL HIGH (ref 4.0–10.5)

## 2011-08-16 LAB — RAPID URINE DRUG SCREEN, HOSP PERFORMED
Barbiturates: NOT DETECTED
Cocaine: NOT DETECTED

## 2011-08-16 LAB — ETHANOL: Alcohol, Ethyl (B): <11 mg/dL (ref 0–11)

## 2011-08-16 MED ORDER — ACETAMINOPHEN 325 MG PO TABS
650.0000 mg | ORAL_TABLET | ORAL | Status: DC | PRN
  Administered 2011-08-16: 650 mg via ORAL
  Filled 2011-08-16: qty 2

## 2011-08-16 MED ORDER — IBUPROFEN 800 MG PO TABS
800.0000 mg | ORAL_TABLET | Freq: Once | ORAL | Status: AC
  Administered 2011-08-16: 800 mg via ORAL
  Filled 2011-08-16: qty 1

## 2011-08-16 MED ORDER — SODIUM CHLORIDE 0.9 % IV SOLN
INTRAVENOUS | Status: DC
  Administered 2011-08-16: 14:00:00 via INTRAVENOUS

## 2011-08-16 MED ORDER — ALUM & MAG HYDROXIDE-SIMETH 200-200-20 MG/5ML PO SUSP: 30.0000 mL | ORAL | Status: DC | PRN

## 2011-08-16 MED ORDER — ONDANSETRON HCL 4 MG PO TABS: 4.0000 mg | ORAL_TABLET | Freq: Three times a day (TID) | ORAL | Status: DC | PRN

## 2011-08-16 MED ORDER — SODIUM CHLORIDE 0.9 % IV BOLUS (SEPSIS)
1000.0000 mL | Freq: Once | INTRAVENOUS | Status: AC
  Administered 2011-08-16: 1000 mL via INTRAVENOUS

## 2011-08-16 MED ORDER — ACETAMINOPHEN 500 MG PO TABS
ORAL_TABLET | ORAL | Status: AC
  Filled 2011-08-16: qty 2

## 2011-08-16 MED ORDER — IBUPROFEN 400 MG PO TABS: 600.0000 mg | ORAL_TABLET | Freq: Three times a day (TID) | ORAL | Status: DC | PRN

## 2011-08-16 MED ORDER — NALOXONE HCL 1 MG/ML IJ SOLN
INTRAMUSCULAR | Status: AC
  Filled 2011-08-16: qty 2

## 2011-08-16 MED ORDER — NALOXONE HCL 1 MG/ML IJ SOLN
1.0000 mg | Freq: Once | INTRAMUSCULAR | Status: AC
  Administered 2011-08-16: 1 mg via INTRAVENOUS

## 2011-08-16 NOTE — ED Notes (Signed)
Pt on phone talking w/ family at this time.

## 2011-08-16 NOTE — ED Notes (Signed)
Pt aroused, answering questions appropriately after Narcan administration. NAD. VSS

## 2011-08-16 NOTE — ED Notes (Signed)
?  overdose. Heroin and xanax within the last 12 hours. Unknown amount. Pt alert.

## 2011-08-16 NOTE — Discharge Instructions (Signed)
Go to alcohol rehabilitation program with your uncle

## 2011-08-16 NOTE — ED Notes (Addendum)
Pt requesting his prescribe does of xanax. Was advised not ordered but would speak to the EDP. Pt fears of going into withdrawls Pt requesting something for headache, in formed this nurse would bring him some medication for that.

## 2011-08-16 NOTE — ED Notes (Signed)
Patient provided hot dinner tray and phone to call family. Patient alert, calm and cooperative at this time.

## 2011-08-16 NOTE — ED Provider Notes (Signed)
History     CSN: [id number]  Arrival date & time 08/16/11  3086   First MD Initiated Contact with Patient 08/16/11 1013      Chief Complaint  Patient presents with  . Drug Overdose    (Consider location/radiation/quality/duration/timing/severity/associated sxs/prior treatment) HPI  Past Medical History  Diagnosis Date  . Chronic back pain   . Drug abuse and dependence     Past Surgical History  Procedure Date  . Appendectomy     No family history on file.  History  Substance Use Topics  . Smoking status: Current Some Day Smoker  . Smokeless tobacco: Not on file  . Alcohol Use: No      Review of Systems  Allergies  Ciprofloxacin hcl and Flagyl  Home Medications   Current Outpatient Rx  Name Route Sig Dispense Refill  . ALPRAZOLAM 2 MG PO TABS Oral Take 2 mg by mouth 2 (two) times daily.      BP 113/66  Pulse 75  Temp(Src) 98.4 F (36.9 C) (Oral)  Resp 11  Wt 175 lb (79.379 kg)  SpO2 99%  Physical Exam  ED Course  Procedures (including critical care time)  Labs Reviewed  CBC - Abnormal; Notable for the following:    WBC 14.3 (*)    Hemoglobin 12.9 (*)    All other components within normal limits  DIFFERENTIAL - Abnormal; Notable for the following:    Neutrophils Relative 87 (*)    Neutro Abs 12.4 (*)    Lymphocytes Relative 6 (*)    All other components within normal limits  COMPREHENSIVE METABOLIC PANEL - Abnormal; Notable for the following:    Glucose, Bld 148 (*)    All other components within normal limits  URINE RAPID DRUG SCREEN (HOSP PERFORMED) - Abnormal; Notable for the following:    Opiates POSITIVE (*)    Benzodiazepines POSITIVE (*)    Tetrahydrocannabinol POSITIVE (*)    All other components within normal limits  SALICYLATE LEVEL - Abnormal; Notable for the following:    Salicylate Lvl <2.0 (*)    All other components within normal limits  ACETAMINOPHEN LEVEL  ETHANOL   No results found.   1. Narcotic abuse   2.  Heroin overdose   3. Benzodiazepine abuse   4. Benzodiazepine overdose       MDM  Recheck at 2100:  Patient is alert and oriented x3. Pleasant. Cooperative. No suicidal or homicidal ideation. He wants to go home. His uncle will transport him to an alcohol rehabilitation facility in [place].        Donnetta Hutching, MD 08/16/11 2105

## 2011-08-16 NOTE — ED Notes (Signed)
Pt alert & oriented x4, stable gait. Pt given discharge instructions, paperwork. Patient instructed to stop at the registration desk to finish any additional paperwork. pt verbalized understanding. Pt making arrangements to go to [facility] for rehab. Pt left department w/ no further questions.

## 2011-08-16 NOTE — ED Provider Notes (Signed)
History  This chart was scribed for Felisa Bonier, MD by Bennett Scrape and Cherlynn Perches. This patient was seen in room APA18/APA18 and the patient's care was started at 11:30AM.   CSN: [id number]  Arrival date & time 08/16/11  0948   First MD Initiated Contact with Patient 08/16/11 1013     Level 5 Caveat-Pt is in a somnolent state.   Chief Complaint  Patient presents with  . Drug Overdose    The history is provided by the patient.    Steven Arias is a 31 y.o. male with a history of drug abuse and dependence who presents to the Emergency Department complaining of overdose of heroin and xanax. Pt reports using 4 bags of heroin last night and 2 bags this morning administered by injection. When asked how many xanax he took, pt replied with "a lot." Per ED nurse, friend called EMS after pt went missing for 8 hours after borrowing friend's car. Friend was concerned about patient's state. Pt reports that it is unusual for him to use that much heroin at one time. He states that he's been using heroin "for a while, but was trying to quit." Pt was seen in this ED for substance abuse in February 2013. Pt has h/o chronic back pain, which he says is the reason for his drug abuse.     Past Medical History  Diagnosis Date  . Chronic back pain   . Drug abuse and dependence     Past Surgical History  Procedure Date  . Appendectomy     No family history on file.  History  Substance Use Topics  . Smoking status: Current Some Day Smoker  . Smokeless tobacco: Not on file  . Alcohol Use: No      Review of Systems  Unable to perform ROS: Other    Allergies  Ciprofloxacin hcl and Flagyl  Home Medications  No current outpatient prescriptions on file.  Triage Vitals: BP 123/61  Pulse 94  Temp(Src) 98.4 F (36.9 C) (Oral)  Resp 16  Wt 175 lb (79.379 kg)  SpO2 98%  Physical Exam  Nursing note and vitals reviewed. Constitutional: He appears well-developed and  well-nourished.       somnolent  HENT:  Head: Normocephalic and atraumatic.       Dry mucous membranes  Eyes: Pupils are equal, round, and reactive to light.       Pupils are pinpoint and responsive  Neck: Normal range of motion. No tracheal deviation present.  Cardiovascular: Normal rate, regular rhythm and normal heart sounds.  Exam reveals no gallop and no friction rub.   No murmur heard. Pulmonary/Chest: Effort normal and breath sounds normal. No respiratory distress. He has no wheezes. He has no rales.       Lungs are clear to auscultation, no rhonchi  Abdominal: Soft. He exhibits no distension.       Hypoactive bowel sounds  Musculoskeletal: Normal range of motion. He exhibits no edema.       Moving all extremities  Neurological:       Lethargic, but arouses to voice  Skin: Skin is warm and dry.    ED Course  Procedures (including critical care time)  DIAGNOSTIC STUDIES: Oxygen Saturation is 98% on O2, normal by my interpretation.    COORDINATION OF CARE: 11:42AM - Gave pt a dose of narco. His behavior normalized enough to obtain further information. 11:46AM - Discussed pt needing to stay until narcotics wear off. Pt requested help  for narcotic abuse.   Date: 08/16/2011  Rate: 88  Rhythm: normal sinus rhythm  QRS Axis: normal  Intervals: normal  ST/T Wave abnormalities: normal  Conduction Disutrbances:none  Narrative Interpretation: normal EKG  Old EKG Reviewed: none available   Labs Reviewed  CBC - Abnormal; Notable for the following:    WBC 14.3 (*)    Hemoglobin 12.9 (*)    All other components within normal limits  DIFFERENTIAL - Abnormal; Notable for the following:    Neutrophils Relative 87 (*)    Neutro Abs 12.4 (*)    Lymphocytes Relative 6 (*)    All other components within normal limits  COMPREHENSIVE METABOLIC PANEL - Abnormal; Notable for the following:    Glucose, Bld 148 (*)    All other components within normal limits  SALICYLATE LEVEL -  Abnormal; Notable for the following:    Salicylate Lvl <2.0 (*)    All other components within normal limits  ACETAMINOPHEN LEVEL  ETHANOL  URINE RAPID DRUG SCREEN (HOSP PERFORMED)   No results found.   No diagnosis found.    MDM  Apparent overdose of heroin and benzodiazepine, not intentional but the result of substance abuse. The patient also reports chronic problems with narcotic abuse and benzodiazepine abuse. He states during his lucid. After Narcan administration that he does want substance abuse treatment help. The patient will need observation to obtain improved sobriety prior to behavioral health evaluation. I have notified our behavioral health ACT team about the patient and they will evaluate him when he is medically cleared. Currently, the patient is sleeping, arousable to voice, with stable vital signs, and intact respiratory drive and ventilatory status, with good oxygenation. Patient signed out to Dr. Adriana Simas for further management.     I personally performed the services described in this documentation, which was scribed in my presence. The recorded information has been reviewed and considered.    Felisa Bonier, MD 08/16/11 1630

## 2011-08-16 NOTE — ED Notes (Signed)
Patient lying in bed with eyes closed. Arouses to voice and light touch. NAD noted. Remains on cardiac monitoring. Will continue to monitor.

## 2011-10-08 ENCOUNTER — Encounter (HOSPITAL_BASED_OUTPATIENT_CLINIC_OR_DEPARTMENT_OTHER): Payer: Self-pay | Admitting: *Deleted

## 2011-10-08 ENCOUNTER — Emergency Department (HOSPITAL_BASED_OUTPATIENT_CLINIC_OR_DEPARTMENT_OTHER): Payer: Self-pay

## 2011-10-08 ENCOUNTER — Emergency Department (HOSPITAL_BASED_OUTPATIENT_CLINIC_OR_DEPARTMENT_OTHER)
Admission: EM | Admit: 2011-10-08 | Discharge: 2011-10-08 | Disposition: A | Discharge status: Home / Self Care | Payer: Self-pay | Attending: Emergency Medicine | Admitting: Emergency Medicine

## 2011-10-08 DIAGNOSIS — Z79899 Other long term (current) drug therapy: Secondary | ICD-10-CM | POA: Insufficient documentation

## 2011-10-08 DIAGNOSIS — M549 Dorsalgia, unspecified: Secondary | ICD-10-CM | POA: Insufficient documentation

## 2011-10-08 DIAGNOSIS — M5380 Other specified dorsopathies, site unspecified: Secondary | ICD-10-CM | POA: Insufficient documentation

## 2011-10-08 LAB — CBC
Platelets: 257 10*3/uL (ref 150–400)
RBC: 4.63 MIL/uL (ref 4.22–5.81)
WBC: 7.7 10*3/uL (ref 4.0–10.5)

## 2011-10-08 LAB — BASIC METABOLIC PANEL
CO2: 31 mEq/L (ref 19–32)
Chloride: 103 mEq/L (ref 96–112)
Glucose, Bld: 78 mg/dL (ref 70–99)
Potassium: 4.8 mEq/L (ref 3.5–5.1)
Sodium: 140 mEq/L (ref 135–145)

## 2011-10-08 LAB — DIFFERENTIAL
Lymphocytes Relative: 25 % (ref 12–46)
Lymphs Abs: 1.9 10*3/uL (ref 0.7–4.0)
Neutrophils Relative %: 59 % (ref 43–77)

## 2011-10-08 MED ORDER — IBUPROFEN 600 MG PO TABS: 600.0000 mg | ORAL_TABLET | Freq: Four times a day (QID) | ORAL | Status: AC | PRN

## 2011-10-08 MED ORDER — TRAMADOL HCL 50 MG PO TABS: 50.0000 mg | ORAL_TABLET | Freq: Four times a day (QID) | ORAL | Status: AC | PRN

## 2011-10-08 MED ORDER — CYCLOBENZAPRINE HCL 10 MG PO TABS
10.0000 mg | ORAL_TABLET | Freq: Once | ORAL | Status: AC
  Administered 2011-10-08: 10 mg via ORAL
  Filled 2011-10-08: qty 1

## 2011-10-08 MED ORDER — IOHEXOL 300 MG/ML  SOLN
100.0000 mL | Freq: Once | INTRAMUSCULAR | Status: AC | PRN
  Administered 2011-10-08: 70 mL via INTRAVENOUS

## 2011-10-08 MED ORDER — TRAMADOL HCL 50 MG PO TABS
100.0000 mg | ORAL_TABLET | Freq: Once | ORAL | Status: AC
  Administered 2011-10-08: 100 mg via ORAL
  Filled 2011-10-08: qty 2

## 2011-10-08 MED ORDER — KETOROLAC TROMETHAMINE 30 MG/ML IJ SOLN
30.0000 mg | Freq: Once | INTRAMUSCULAR | Status: AC
  Administered 2011-10-08: 30 mg via INTRAMUSCULAR
  Filled 2011-10-08: qty 1

## 2011-10-08 NOTE — ED Notes (Signed)
Pt states he has a knot on his upper back for 2 days no known injury no treatment at home

## 2011-10-08 NOTE — ED Provider Notes (Signed)
History     CSN: [id number]  Arrival date & time 10/08/11  0801   First MD Initiated Contact with Patient 10/08/11 [phone]      Chief Complaint  Patient presents with  . Back Pain    (Consider location/radiation/quality/duration/timing/severity/associated sxs/prior treatment) HPI  31yoM h/o chronic back pain, polysubstance abuse including inj heroin pw back pain. Pt c/o worsening back pain upper back x 1-2 days. Describes as sharp, worse with movement and deep breathing. He denies radiation. He denies chest pain, shortness of breath, nausea, vomiting, diaphoresis. He denies weakness of his upper extremities. He denies numbness, tingling of his upper extremity. He remains ambulatory. He states that this is different than his chronic intermittent upper back pain because "it came out of the blue".  Rates as 9/10 at this time.  Denies history of recent trauma/falls. Denies h/o malignancy, DM,immunosuppression, indwelling urinary catheter, prolonged steroid use, skin or urinary tract infection. No numbness/tingling/weakness of extremities. Denies fever/chills. Denies saddle anesthesia, no urinary incontinence or retention. He does have h/o inj heroin abuse, states "I've been clean for the past 6 months". States "it feels like a knot is up there", but states that he did not palpate a knot       Melissa Ramer Lottie Rater, RN 10/08/2011 08:12      Pt states he has a knot on his upper back for 2 days no known injury no treatment at home    Past Medical History  Diagnosis Date  . Chronic back pain   . Drug abuse and dependence     Past Surgical History  Procedure Date  . Appendectomy     History reviewed. No pertinent family history.  History  Substance Use Topics  . Smoking status: Current Some Day Smoker  . Smokeless tobacco: Not on file  . Alcohol Use: No    Review of Systems  All other systems reviewed and are negative.  except as noted HPI   Allergies  Ciprofloxacin hcl and  Flagyl  Home Medications   Current Outpatient Rx  Name Route Sig Dispense Refill  . ESCITALOPRAM OXALATE 10 MG PO TABS Oral Take 10 mg by mouth daily.    Marland Kitchen ALPRAZOLAM 2 MG PO TABS Oral Take 2 mg by mouth 2 (two) times daily.    . IBUPROFEN 600 MG PO TABS Oral Take 1 tablet (600 mg total) by mouth every 6 (six) hours as needed for pain. 30 tablet 0  . TRAMADOL HCL 50 MG PO TABS Oral Take 1 tablet (50 mg total) by mouth every 6 (six) hours as needed for pain. 30 tablet 0    BP 118/69  Pulse 71  Temp(Src) 98.1 F (36.7 C) (Oral)  Resp 20  Ht 5\' 10"  (1.778 m)  Wt 182 lb 1.6 oz (82.6 kg)  BMI 26.13 kg/m2  SpO2 100%  Physical Exam  Nursing note and vitals reviewed. Constitutional: He is oriented to person, place, and time. He appears well-developed and well-nourished. No distress.  HENT:  Head: Atraumatic.  Mouth/Throat: Oropharynx is clear and moist.  Eyes: Conjunctivae are normal. Pupils are equal, round, and reactive to light.  Neck: Neck supple.  Cardiovascular: Normal rate, regular rhythm, normal heart sounds and intact distal pulses.  Exam reveals no gallop and no friction rub.   No murmur heard. Pulmonary/Chest: Effort normal. No respiratory distress. He has no wheezes. He has no rales.  Abdominal: Soft. Bowel sounds are normal. There is no tenderness. There is no rebound and no guarding.  Musculoskeletal: Normal range of motion. He exhibits no edema and no tenderness.       +midline upper thoracic ttp No palpable mass  Neurological: He is alert and oriented to person, place, and time.  Skin: Skin is warm and dry.  Psychiatric: He has a normal mood and affect.    ED Course  Procedures (including critical care time)   Labs Reviewed  CBC  DIFFERENTIAL  BASIC METABOLIC PANEL   Dg Thoracic Spine 2 View  10/08/2011  *RADIOLOGY REPORT*  Clinical Data: Back pain.  No known injury.  THORACIC SPINE - 2 VIEW  Comparison: CT chest 10/26/2009  Findings: Normal alignment.   Early degenerative spurring anteriorly within the mid and lower thoracic spine.  No fracture.  Visualized ribs unremarkable.  Visualized lung fields clear.  IMPRESSION: No acute findings.  Original Report Authenticated By: Cyndie Chime, M.D.   Ct Thoracic Spine W Contrast  10/08/2011  *RADIOLOGY REPORT*  Clinical Data: Sharp pain between shoulder blades  CT THORACIC SPINE WITH CONTRAST  Technique:  Multidetector CT imaging of the thoracic spine was performed during intravenous contrast administration. Multiplanar CT image reconstructions were also generated  Contrast: 70mL OMNIPAQUE IOHEXOL 300 MG/ML  SOLN  Comparison: CT chest 10/26/2009  Findings: Anatomic alignment of the thoracic spine.  No osseous destructive lesion.  Intervertebral disc spaces are preserved.  No signs of diskitis or osteomyelitis.  There is slight prominence of the subcutaneous fat in the mid to upper thoracic region similar to priors in the region between shoulder blades.  Minimal subcutaneous stranding is noted.  There is no fluid collection or abscess. There is no encapsulated lipoma. No muscular atrophy is seen.  The similar appearance was seen on previous chest CT from 2 years ago and is not felt to be pathologic. Visualized pulmonary thoracic and mediastinal structures unremarkable.  IMPRESSION: Slight prominence of the subcutaneous fat in the mid to upper thoracic region but no evidence for focal fluid collection or abscess.  This area is unchanged in appearance from previous chest CT 2 years ago.  No evidence for thoracic diskitis or osteomyelitis.  Original Report Authenticated By: Elsie Stain, M.D.    1. Back pain     MDM  Well appearing 25 yoM h/o polysubstance abuse including IV here on use presents with back pain. He does have a history of chronic back pain is the same location only he states that it feels slightly different. He has minimal midline tenderness. No systemic signs symptoms of infection. The patient  adamantly denied IV drug abuse in the past 6 months but his chart revealed subsequently that he was independent for heroin overdose in April 2013. At that time further workup was initiated including CBC and CT scan of his thoracic spine with contrast. Labs unremarkable including no leukocytosis. CT thoracic spine with slight prominence of the subcutaneous fat in the mid to upper thoracic region which is unchanged from previous. The patient did have some pain relief with Toradol, Ultram, Flexeril in the emergency department he will be discharged home with ibuprofen and Ultram given his history of narcotic abuse. He has been given strict precautions were returned to the emergency department and is comfortable with discharge home.        Forbes Cellar, MD 10/08/11 1226

## 2011-10-08 NOTE — ED Notes (Signed)
Patient transported to X-ray 

## 2011-10-08 NOTE — ED Notes (Signed)
Patient transported to CT 

## 2011-10-08 NOTE — ED Notes (Signed)
MD at bedside. 

## 2011-10-08 NOTE — Discharge Instructions (Signed)
Back Pain, Adult Low back pain is very common. About 1 in 5 people have back pain.The cause of low back pain is rarely dangerous. The pain often gets better over time.About half of people with a sudden onset of back pain feel better in just 2 weeks. About 8 in 10 people feel better by 6 weeks.  CAUSES Some common causes of back pain include:  Strain of the muscles or ligaments supporting the spine.   Wear and tear (degeneration) of the spinal discs.   Arthritis.   Direct injury to the back.  DIAGNOSIS Most of the time, the direct cause of low back pain is not known.However, back pain can be treated effectively even when the exact cause of the pain is unknown.Answering your caregiver's questions about your overall health and symptoms is one of the most accurate ways to make sure the cause of your pain is not dangerous. If your caregiver needs more information, he or she may order lab work or imaging tests (X-rays or MRIs).However, even if imaging tests show changes in your back, this usually does not require surgery. HOME CARE INSTRUCTIONS For many people, back pain returns.Since low back pain is rarely dangerous, it is often a condition that people can learn to manageon their own.   Remain active. It is stressful on the back to sit or stand in one place. Do not sit, drive, or stand in one place for more than 30 minutes at a time. Take short walks on level surfaces as soon as pain allows.Try to increase the length of time you walk each day.   Do not stay in bed.Resting more than 1 or 2 days can delay your recovery.   Do not avoid exercise or work.Your body is made to move.It is not dangerous to be active, even though your back may hurt.Your back will likely heal faster if you return to being active before your pain is gone.   Pay attention to your body when you bend and lift. Many people have less discomfortwhen lifting if they bend their knees, keep the load close to their  bodies,and avoid twisting. Often, the most comfortable positions are those that put less stress on your recovering back.   Find a comfortable position to sleep. Use a firm mattress and lie on your side with your knees slightly bent. If you lie on your back, put a pillow under your knees.   Only take over-the-counter or prescription medicines as directed by your caregiver. Over-the-counter medicines to reduce pain and inflammation are often the most helpful.Your caregiver may prescribe muscle relaxant drugs.These medicines help dull your pain so you can more quickly return to your normal activities and healthy exercise.   Put ice on the injured area.   Put ice in a plastic bag.   Place a towel between your skin and the bag.   Leave the ice on for 15 to 20 minutes, 3 to 4 times a day for the first 2 to 3 days. After that, ice and heat may be alternated to reduce pain and spasms.   Ask your caregiver about trying back exercises and gentle massage. This may be of some benefit.   Avoid feeling anxious or stressed.Stress increases muscle tension and can worsen back pain.It is important to recognize when you are anxious or stressed and learn ways to manage it.Exercise is a great option.  SEEK MEDICAL CARE IF:  You have pain that is not relieved with rest or medicine.   You have   pain that does not improve in 1 week.   You have new symptoms.   You are generally not feeling well.  SEEK IMMEDIATE MEDICAL CARE IF:   You have pain that radiates from your back into your legs.   You develop new bowel or bladder control problems.   You have unusual weakness or numbness in your arms or legs.   You develop nausea or vomiting.   You develop abdominal pain.   You feel faint.  Document Released: 04/23/2005 Document Revised: 04/12/2011 Document Reviewed: 09/11/2010 Exit[employer] Patient Information 2012 [employer]  RESOURCE GUIDE  Dental Problems  Patients with Medicaid: [place]  [facility]                     [facility] [address]                                           [address] Phone:  [phone]                                                   Phone:  [phone]  If unable to pay or uninsured, contact:  [facility] or [facility] to become qualified for the adult dental clinic.  Chronic Pain Problems Contact [facility]  [phone] Patients need to be referred by their primary care doctor.  Insufficient Money for Medicine Contact [employer]:  call "211" or [facility] 271-5999.  No Primary Care Doctor Call [facility]  832-8000 Other agencies that provide inexpensive medical care    [facility]  832-8035    [facility]  832-7272    [facility]  [phone]    Women[facility] Clinic  832-4777    [facility]  [phone]    [facility]  [phone]  Psychological Services [facility]  [phone] [facility]  [phone] [facility]   800 853-5163 (emergency services 641-4993)  Abuse/Neglect [place] Child Abuse Hotline (336) 641-3795 [place] Child Abuse Hotline 800-378-5315 (After Hours)  Emergency Shelter [facility] (336) 271-5985  [facility] (336) 275-9566 [facility] (704) 372-4663  MRSA Hotline #:   832-7006    [place] Resources  [facility] [place]  [employer]                           [facility] [address] [place]                     [address]         [address]                                                 [place]                                [place] Phone:  [phone]                                  Phone:  [phone]                   Phone:  [phone]  [facility] Phone:  [phone]  [place]  Child Abuse Hotline (336) 342-1394 (336) 342-3537 (After Hours)  

## 2011-11-06 ENCOUNTER — Encounter (HOSPITAL_BASED_OUTPATIENT_CLINIC_OR_DEPARTMENT_OTHER): Payer: Self-pay

## 2011-11-06 ENCOUNTER — Emergency Department (HOSPITAL_BASED_OUTPATIENT_CLINIC_OR_DEPARTMENT_OTHER)
Admission: EM | Admit: 2011-11-06 | Discharge: 2011-11-06 | Disposition: A | Discharge status: Home / Self Care | Payer: Self-pay | Attending: Emergency Medicine | Admitting: Emergency Medicine

## 2011-11-06 DIAGNOSIS — F19939 Other psychoactive substance use, unspecified with withdrawal, unspecified: Secondary | ICD-10-CM

## 2011-11-06 DIAGNOSIS — G8929 Other chronic pain: Secondary | ICD-10-CM | POA: Insufficient documentation

## 2011-11-06 DIAGNOSIS — F419 Anxiety disorder, unspecified: Secondary | ICD-10-CM

## 2011-11-06 DIAGNOSIS — F411 Generalized anxiety disorder: Secondary | ICD-10-CM | POA: Insufficient documentation

## 2011-11-06 DIAGNOSIS — F1320 Sedative, hypnotic or anxiolytic dependence, uncomplicated: Secondary | ICD-10-CM

## 2011-11-06 DIAGNOSIS — F1310 Sedative, hypnotic or anxiolytic abuse, uncomplicated: Secondary | ICD-10-CM

## 2011-11-06 DIAGNOSIS — F17200 Nicotine dependence, unspecified, uncomplicated: Secondary | ICD-10-CM | POA: Insufficient documentation

## 2011-11-06 HISTORY — DX: Panic disorder (episodic paroxysmal anxiety): F41.0

## 2011-11-06 HISTORY — DX: Anxiety disorder, unspecified: F41.9

## 2011-11-06 MED ORDER — ALPRAZOLAM 1 MG PO TABS: 1.0000 mg | ORAL_TABLET | Freq: Three times a day (TID) | ORAL | Status: AC | PRN

## 2011-11-06 NOTE — ED Notes (Signed)
C/o increased in panic attacks and anxiety-on xanax since 2006-PCP retired and pt has been out x 10 days-NAD at present

## 2011-11-06 NOTE — ED Provider Notes (Signed)
History     CSN: [id number]  Arrival date & time 11/06/11  1549   First MD Initiated Contact with Patient 11/06/11 1811      Chief Complaint  Patient presents with  . Panic Attack    (Consider location/radiation/quality/duration/timing/severity/associated sxs/prior treatment) HPI This 31 year old male ran out of his Xanax which he is taking for years, she has a history of IV heroin abuse and Xanax abuse. He last overdosed on heroin and Xanax in April of this year. He states his doctor retired and is supposed to go to a new doctor later this week. He states he ran out of his Xanax causing anxiety and panic feeling now. He denies any threats or himself or others. He denies suicidal or homicidal ideation or hallucinations. He states he is not in severe withdrawal but he does feel anxious jittery shaky and some slight nausea but no vomiting yet. He is no chest pain shortness breath abdominal pain. He is no headache or change in speech vision swallowing or understanding weakness or numbness. He states he needs enough Xanax to get him through to see his new doctor in a few days. He is not interested in detox. Past Medical History  Diagnosis Date  . Chronic back pain   . Drug abuse and dependence   . Anxiety   . Panic attack     Past Surgical History  Procedure Date  . Appendectomy     No family history on file.  History  Substance Use Topics  . Smoking status: Current Some Day Smoker  . Smokeless tobacco: Not on file  . Alcohol Use: No      Review of Systems  Constitutional: Negative for fever.       10 Systems reviewed and are negative for acute change except as noted in the HPI.  HENT: Negative for congestion.   Eyes: Negative for discharge and redness.  Respiratory: Negative for cough and shortness of breath.   Cardiovascular: Negative for chest pain.  Gastrointestinal: Positive for nausea. Negative for vomiting and abdominal pain.  Musculoskeletal: Negative for back  pain.  Skin: Negative for rash.  Neurological: Negative for syncope, numbness and headaches.  Psychiatric/Behavioral: Negative for suicidal ideas, hallucinations and self-injury. The patient is nervous/anxious.        No behavior change.    Allergies  Ciprofloxacin hcl and Flagyl  Home Medications   Current Outpatient Rx  Name Route Sig Dispense Refill  . ALPRAZOLAM 2 MG PO TABS Oral Take 2 mg by mouth at bedtime as needed. Patient uses this medication for anxiety.    . ALPRAZOLAM 1 MG PO TABS Oral Take 1 tablet (1 mg total) by mouth 3 (three) times daily as needed for anxiety. 12 tablet 0  . ALPRAZOLAM 2 MG PO TABS Oral Take 2 mg by mouth 2 (two) times daily.    Marland Kitchen ESCITALOPRAM OXALATE 10 MG PO TABS Oral Take 10 mg by mouth daily.      BP 129/61  Pulse 60  Temp 97.7 F (36.5 C) (Oral)  Resp 20  Ht 5\' 10"  (1.778 m)  Wt 178 lb (80.74 kg)  BMI 25.54 kg/m2  SpO2 100%  Physical Exam  Nursing note and vitals reviewed. Constitutional:       Awake, alert, nontoxic appearance with baseline speech for patient.  HENT:  Head: Atraumatic.  Mouth/Throat: No oropharyngeal exudate.  Eyes: EOM are normal. Pupils are equal, round, and reactive to light. Right eye exhibits no discharge. Left eye exhibits  no discharge.  Neck: Neck supple.  Cardiovascular: Normal rate and regular rhythm.   No murmur heard. Pulmonary/Chest: Effort normal and breath sounds normal. No stridor. No respiratory distress. He has no wheezes. He has no rales. He exhibits no tenderness.  Abdominal: Soft. Bowel sounds are normal. He exhibits no mass. There is no tenderness. There is no rebound.  Musculoskeletal: He exhibits no tenderness.       Baseline ROM, moves extremities with no obvious new focal weakness.  Lymphadenopathy:    He has no cervical adenopathy.  Neurological: He is alert.       Awake, alert, cooperative and aware of situation; motor strength bilaterally; sensation normal to light touch bilaterally;  peripheral visual fields full to confrontation; no facial asymmetry; tongue midline; major cranial nerves appear intact; no pronator drift, normal finger to nose bilaterally, baseline gait without new ataxia.  Skin: No rash noted.  Psychiatric:       Anxious-appearing    ED Course  Procedures (including critical care time)  Labs Reviewed - No data to display No results found.   1. Anxiety   2. Drug withdrawal   3. Benzodiazepine abuse   4. Benzodiazepine dependence       MDM  Pt stable in ED with no significant deterioration in condition.Patient / Family / Caregiver informed of clinical course, understand medical decision-making process, and agree with plan.I doubt any other EMC precluding discharge at this time including, but not necessarily limited to the following:severe withdrawal.        Hurman Horn, MD 11/12/11 [phone]

## 2011-11-13 ENCOUNTER — Encounter (HOSPITAL_BASED_OUTPATIENT_CLINIC_OR_DEPARTMENT_OTHER): Payer: Self-pay | Admitting: Emergency Medicine

## 2011-11-13 ENCOUNTER — Emergency Department (HOSPITAL_BASED_OUTPATIENT_CLINIC_OR_DEPARTMENT_OTHER)
Admission: EM | Admit: 2011-11-13 | Discharge: 2011-11-13 | Disposition: A | Discharge status: Home / Self Care | Payer: Self-pay | Attending: Emergency Medicine | Admitting: Emergency Medicine

## 2011-11-13 DIAGNOSIS — F419 Anxiety disorder, unspecified: Secondary | ICD-10-CM

## 2011-11-13 DIAGNOSIS — G8929 Other chronic pain: Secondary | ICD-10-CM | POA: Insufficient documentation

## 2011-11-13 DIAGNOSIS — F411 Generalized anxiety disorder: Secondary | ICD-10-CM | POA: Insufficient documentation

## 2011-11-13 DIAGNOSIS — M549 Dorsalgia, unspecified: Secondary | ICD-10-CM | POA: Insufficient documentation

## 2011-11-13 DIAGNOSIS — F1320 Sedative, hypnotic or anxiolytic dependence, uncomplicated: Secondary | ICD-10-CM | POA: Insufficient documentation

## 2011-11-13 DIAGNOSIS — F17200 Nicotine dependence, unspecified, uncomplicated: Secondary | ICD-10-CM | POA: Insufficient documentation

## 2011-11-13 DIAGNOSIS — Z881 Allergy status to other antibiotic agents status: Secondary | ICD-10-CM | POA: Insufficient documentation

## 2011-11-13 DIAGNOSIS — Z888 Allergy status to other drugs, medicaments and biological substances status: Secondary | ICD-10-CM | POA: Insufficient documentation

## 2011-11-13 MED ORDER — ALPRAZOLAM 1 MG PO TABS: 1.0000 mg | ORAL_TABLET | Freq: Every evening | ORAL | Status: DC | PRN

## 2011-11-13 NOTE — ED Notes (Signed)
Pt. Reports he is feeling shaky and his last xanax was on the 7th on July at approx. 1000am

## 2011-11-13 NOTE — ED Notes (Signed)
C/o anxiety.  Seen here 7/2 for anxiety.  Has appoint on 7/19 for medications but has run out of the medications we gave on 7/2.

## 2011-11-13 NOTE — ED Provider Notes (Signed)
History     CSN: [id number]  Arrival date & time 11/13/11  1818   First MD Initiated Contact with Patient 11/13/11 1846      Chief Complaint  Patient presents with  . Anxiety    (Consider location/radiation/quality/duration/timing/severity/associated sxs/prior treatment) HPI Comments: Pt states that he has a history of anxiety and he is getting a new pcp who is supposed to see him on 7/19:pt denies si/hi:pt states that he ran out of his medications 2 days ago and he is really anxious again  Patient is a 31 y.o. male presenting with anxiety. The history is provided by the patient. No language interpreter was used.  Anxiety This is a chronic problem. The current episode started in the past 7 days. The problem occurs constantly. The problem has been unchanged. The symptoms are aggravated by stress. He has tried nothing for the symptoms.    Past Medical History  Diagnosis Date  . Chronic back pain   . Drug abuse and dependence   . Anxiety   . Panic attack     Past Surgical History  Procedure Date  . Appendectomy     No family history on file.  History  Substance Use Topics  . Smoking status: Current Some Day Smoker  . Smokeless tobacco: Not on file  . Alcohol Use: No      Review of Systems  Constitutional: Negative.   Respiratory: Negative.   Cardiovascular: Negative.     Allergies  Ciprofloxacin hcl and Flagyl  Home Medications   Current Outpatient Rx  Name Route Sig Dispense Refill  . ALPRAZOLAM 1 MG PO TABS Oral Take 1 tablet (1 mg total) by mouth 3 (three) times daily as needed for anxiety. 12 tablet 0  . ALPRAZOLAM 2 MG PO TABS Oral Take 2 mg by mouth 2 (two) times daily.      BP 124/72  Pulse 104  Temp 98.2 F (36.8 C) (Oral)  Resp 16  Ht 5\' 10"  (1.778 m)  Wt 175 lb (79.379 kg)  BMI 25.11 kg/m2  SpO2 100%  Physical Exam  Nursing note and vitals reviewed. Constitutional: He is oriented to person, place, and time. He appears well-developed and  well-nourished.  Cardiovascular: Normal rate and regular rhythm.   Pulmonary/Chest: Effort normal and breath sounds normal.  Neurological: He is alert and oriented to person, place, and time.  Skin: Skin is warm and dry.  Psychiatric: His mood appears anxious.    ED Course  Procedures (including critical care time)  Labs Reviewed - No data to display No results found.   1. Anxiety   2. Benzodiazepine dependence       MDM  Pt given a couple of pills and discussed with pt that this is not something that will continue:pt denies si/hi       Teressa Lower, NP 11/13/11 1914

## 2011-11-13 NOTE — ED Notes (Signed)
No thoughts of harming self per Pt.  Pt. Is reporting he can't work and can hardly function.

## 2011-11-14 NOTE — ED Provider Notes (Signed)
Medical screening examination/treatment/procedure(s) were performed by non-physician practitioner and as supervising physician I was immediately available for consultation/collaboration.   Charles B. Sheldon, MD 11/14/11 0948 

## 2011-11-18 ENCOUNTER — Encounter (HOSPITAL_COMMUNITY): Payer: Self-pay | Admitting: *Deleted

## 2011-11-18 ENCOUNTER — Emergency Department (HOSPITAL_COMMUNITY)
Admission: EM | Admit: 2011-11-18 | Discharge: 2011-11-18 | Disposition: A | Discharge status: Home / Self Care | Payer: Self-pay | Attending: Emergency Medicine | Admitting: Emergency Medicine

## 2011-11-18 DIAGNOSIS — F1920 Other psychoactive substance dependence, uncomplicated: Secondary | ICD-10-CM | POA: Insufficient documentation

## 2011-11-18 DIAGNOSIS — F17200 Nicotine dependence, unspecified, uncomplicated: Secondary | ICD-10-CM | POA: Insufficient documentation

## 2011-11-18 DIAGNOSIS — F410 Panic disorder [episodic paroxysmal anxiety] without agoraphobia: Secondary | ICD-10-CM | POA: Insufficient documentation

## 2011-11-18 DIAGNOSIS — G8929 Other chronic pain: Secondary | ICD-10-CM | POA: Insufficient documentation

## 2011-11-18 DIAGNOSIS — F419 Anxiety disorder, unspecified: Secondary | ICD-10-CM

## 2011-11-18 NOTE — ED Notes (Signed)
Patient is alert and oriented x3.  He is having anxiety and panics attacks. His previous PCP has retired and he has been referred to another PCP but  He is unable to be seen until the 22nd.  He has ran out of his prescription Xanax and is requesting a prescription till he can be seen.  He Denies any pain, nausea or vomiting

## 2011-11-18 NOTE — ED Provider Notes (Signed)
History     CSN: [id number]  Arrival date & time 11/18/11  1658   First MD Initiated Contact with Patient 11/18/11 1708      Chief Complaint  Patient presents with  . Anxiety  . Panic Attack    (Consider location/radiation/quality/duration/timing/severity/associated sxs/prior treatment) Patient is a 31 y.o. Steven Arias presenting with anxiety. The history is provided by the patient.  Anxiety This is a chronic problem. Pertinent negatives include no chest pain and no abdominal pain.   patient is requesting treatment for his anxiety and panic attacks. He has a history of same. He has been on Xanax, but states his primary care Dr. is retired and is not able to see another one until the 22nd. He states he has ran out of his medicines. He was seen in the ER twice previously for the same. He states no other medicines work. He states they have tried other medicines without success.  Past Medical History  Diagnosis Date  . Chronic back pain   . Drug abuse and dependence   . Anxiety   . Panic attack     Past Surgical History  Procedure Date  . Appendectomy     No family history on file.  History  Substance Use Topics  . Smoking status: Current Some Day Smoker  . Smokeless tobacco: Not on file  . Alcohol Use: No      Review of Systems  Constitutional: Negative for appetite change and fatigue.  Eyes: Negative for redness.  Respiratory: Negative for chest tightness.   Cardiovascular: Negative for chest pain.  Gastrointestinal: Negative for abdominal pain.  Musculoskeletal: Negative for back pain.  Neurological: Negative for light-headedness.  Psychiatric/Behavioral: Negative for suicidal ideas and self-injury. The patient is nervous/anxious.     Allergies  Ciprofloxacin hcl and Flagyl  Home Medications   Current Outpatient Rx  Name Route Sig Dispense Refill  . ACETAMINOPHEN 500 MG PO TABS Oral Take 500 mg by mouth every 6 (six) hours as needed. Pain    . ALPRAZOLAM 1 MG  PO TABS Oral Take 1 tablet (1 mg total) by mouth 3 (three) times daily as needed for anxiety. 12 tablet 0    BP 151/81  Pulse 108  Temp 97.8 F (36.6 C) (Oral)  Resp 18  Ht 5\' 10"  (1.778 m)  Wt 180 lb (81.647 kg)  BMI 25.83 kg/m2  SpO2 99%  Physical Exam  Nursing note and vitals reviewed. Constitutional: He is oriented to person, place, and time. He appears well-developed and well-nourished.  HENT:  Head: Normocephalic and atraumatic.  Eyes: EOM are normal. Pupils are equal, round, and reactive to light.  Cardiovascular: Normal rate, regular rhythm and normal heart sounds.   No murmur heard. Pulmonary/Chest: Effort normal and breath sounds normal.  Abdominal: Soft. Bowel sounds are normal. There is no tenderness.  Musculoskeletal: Normal range of motion. He exhibits no edema.  Neurological: He is alert and oriented to person, place, and time. No cranial nerve deficit.  Skin: Skin is warm and dry.  Psychiatric:       Patient appears anxious and somewhat tremulous.    ED Course  Procedures (including critical care time)  Labs Reviewed - No data to display No results found.   1. Anxiety       MDM  Patient requests prescription for Xanax. His been on the same chronically. This is third visit for the same complaint. He was initially given a prescription and then on next visit was given pills  here. At that time he was told he would not get any more from [place]. Additionally he states the followup appointment was the 19th, and now states is a 22nd. Patient has had previous visits recently also for overdose. At this point I do not feel that I can refill his Xanax. He was offered to see someone  for the anxiety and for detox off the Xanax, but patient refused. He'll be discharged        [name]. Rubin Payor, MD 11/18/11 1610

## 2012-08-11 ENCOUNTER — Emergency Department (HOSPITAL_COMMUNITY)
Admission: EM | Admit: 2012-08-11 | Discharge: 2012-08-12 | Disposition: A | Discharge status: Home / Self Care | Payer: Self-pay | Attending: Emergency Medicine | Admitting: Emergency Medicine

## 2012-08-11 ENCOUNTER — Encounter (HOSPITAL_COMMUNITY): Payer: Self-pay | Admitting: Emergency Medicine

## 2012-08-11 DIAGNOSIS — M549 Dorsalgia, unspecified: Secondary | ICD-10-CM | POA: Insufficient documentation

## 2012-08-11 DIAGNOSIS — F17200 Nicotine dependence, unspecified, uncomplicated: Secondary | ICD-10-CM | POA: Insufficient documentation

## 2012-08-11 DIAGNOSIS — Y92009 Unspecified place in unspecified non-institutional (private) residence as the place of occurrence of the external cause: Secondary | ICD-10-CM | POA: Insufficient documentation

## 2012-08-11 DIAGNOSIS — F411 Generalized anxiety disorder: Secondary | ICD-10-CM | POA: Insufficient documentation

## 2012-08-11 DIAGNOSIS — G8929 Other chronic pain: Secondary | ICD-10-CM | POA: Insufficient documentation

## 2012-08-11 DIAGNOSIS — F410 Panic disorder [episodic paroxysmal anxiety] without agoraphobia: Secondary | ICD-10-CM | POA: Insufficient documentation

## 2012-08-11 DIAGNOSIS — Y9389 Activity, other specified: Secondary | ICD-10-CM | POA: Insufficient documentation

## 2012-08-11 DIAGNOSIS — T401X4A Poisoning by heroin, undetermined, initial encounter: Secondary | ICD-10-CM | POA: Insufficient documentation

## 2012-08-11 DIAGNOSIS — F1920 Other psychoactive substance dependence, uncomplicated: Secondary | ICD-10-CM | POA: Insufficient documentation

## 2012-08-11 LAB — BASIC METABOLIC PANEL
BUN: 17 mg/dL (ref 6–23)
CO2: 23 mEq/L (ref 19–32)
Calcium: 8.8 mg/dL (ref 8.4–10.5)
Chloride: 98 mEq/L (ref 96–112)
Creatinine, Ser: 1.34 mg/dL (ref 0.50–1.35)
GFR calc Af Amer: 79 mL/min — ABNORMAL LOW (ref >90)
GFR calc non Af Amer: 68 mL/min — ABNORMAL LOW (ref >90)
Glucose, Bld: 229 mg/dL — ABNORMAL HIGH (ref 70–99)
Potassium: 3.4 mEq/L — ABNORMAL LOW (ref 3.5–5.1)
Sodium: 136 mEq/L (ref 135–145)

## 2012-08-11 LAB — CBC
HCT: 40.4 % (ref 39.0–52.0)
Hemoglobin: 13.4 g/dL (ref 13.0–17.0)
MCH: 30.9 pg (ref 26.0–34.0)
MCHC: 33.2 g/dL (ref 30.0–36.0)
MCV: 93.3 fL (ref 78.0–100.0)
Platelets: 290 10*3/uL (ref 150–400)
RBC: 4.33 MIL/uL (ref 4.22–5.81)
RDW: 13.4 % (ref 11.5–15.5)
WBC: 9.7 10*3/uL (ref 4.0–10.5)

## 2012-08-11 MED ORDER — SODIUM CHLORIDE 0.9 % IV BOLUS (SEPSIS)
1000.0000 mL | Freq: Once | INTRAVENOUS | Status: AC
  Administered 2012-08-11: 1000 mL via INTRAVENOUS

## 2012-08-11 MED ORDER — ONDANSETRON HCL 4 MG/2ML IJ SOLN
4.0000 mg | Freq: Once | INTRAMUSCULAR | Status: AC
  Administered 2012-08-11: 4 mg via INTRAVENOUS
  Filled 2012-08-11: qty 2

## 2012-08-11 NOTE — ED Notes (Signed)
Patient found unconscious by his mother, she called EMS, when they arrived he was awake, but drowsy.  No narcan used by EMS.  Patient admits to using one Xanax tonight before injecting heroin.  Pt remains drowsy upon admission to ED, became nauseated upon arrival.

## 2012-08-11 NOTE — ED Provider Notes (Signed)
History    32 year old male with drug overdose. Patient was found by his mother at home in bed snoring shortly before arrival. Syringe was beside him. Patient admits to using heroin. Relapse after being sober for 90 days. Also says that he took 1 mg of Xanax. Denies any other ingestions. Denies intent for self-harm. Feels tired. No other complaints.   CSN: [id number]  Arrival date & time 08/11/12  2231   First MD Initiated Contact with Patient 08/11/12 2240      Chief Complaint  Patient presents with  . Drug Overdose    (Consider location/radiation/quality/duration/timing/severity/associated sxs/prior treatment) HPI  Past Medical History  Diagnosis Date  . Chronic back pain   . Drug abuse and dependence   . Anxiety   . Panic attack     Past Surgical History  Procedure Laterality Date  . Appendectomy      History reviewed. No pertinent family history.  History  Substance Use Topics  . Smoking status: Current Some Day Smoker  . Smokeless tobacco: Not on file  . Alcohol Use: No      Review of Systems  All systems reviewed and negative, other than as noted in HPI.   Allergies  Ciprofloxacin hcl and Flagyl  Home Medications   Current Outpatient Rx  Name  Route  Sig  Dispense  Refill  . ALPRAZolam (XANAX) 1 MG tablet   Oral   Take 1 mg by mouth 3 (three) times daily as needed for anxiety.           BP 127/82  Pulse 92  Temp(Src) 97.3 F (36.3 C) (Oral)  Resp 16  Wt 175 lb (79.379 kg)  BMI 25.11 kg/m2  SpO2 97%  Physical Exam  Nursing note and vitals reviewed. Constitutional: He is oriented to person, place, and time. He appears well-developed and well-nourished.  HENT:  Head: Normocephalic and atraumatic.  No external signs of acute trauma  Eyes: Conjunctivae are normal. Pupils are equal, round, and reactive to light. Right eye exhibits no discharge. Left eye exhibits no discharge.  Pupils costricted  Neck: Neck supple.  Cardiovascular: Normal  rate, regular rhythm and normal heart sounds.  Exam reveals no gallop and no friction rub.   No murmur heard. Pulmonary/Chest: Effort normal and breath sounds normal. No respiratory distress.  Abdominal: Soft. He exhibits no distension. There is no tenderness.  Musculoskeletal: He exhibits no edema and no tenderness.  Neurological: He is oriented to person, place, and time. No cranial nerve deficit. He exhibits normal muscle tone. Coordination normal.  Drowsy but awakens to voice. Answering questions appropriately.   Skin: Skin is warm and dry.  Psychiatric: He has a normal mood and affect. His behavior is normal. Thought content normal.    ED Course  Procedures (including critical care time)  Labs Reviewed  CBC  BASIC METABOLIC PANEL   No results found.  EKG:  Rhythm: normal sinus Vent. rate 97 BPM PR interval 176 ms QRS duration 92 ms QT/QTc 368/467 ms ST segments: NS ST changes.   1. Opiate overdose, initial encounter       MDM  32 year old male with a clear history of opiate overdose. Drowsy, but easily arousable to voice. No naloxone has been given at this point. He is drowsy, but he is maintaining his oxygen saturations. We'll continue to observe at this time.         Raeford Razor, MD 08/11/12 2318

## 2012-08-12 ENCOUNTER — Encounter (HOSPITAL_COMMUNITY): Payer: Self-pay

## 2012-08-12 ENCOUNTER — Emergency Department (HOSPITAL_COMMUNITY)
Admission: EM | Admit: 2012-08-12 | Discharge: 2012-08-12 | Disposition: A | Discharge status: Rehabilitation Facility | Payer: Self-pay | Attending: Emergency Medicine | Admitting: Emergency Medicine

## 2012-08-12 DIAGNOSIS — T424X4A Poisoning by benzodiazepines, undetermined, initial encounter: Secondary | ICD-10-CM | POA: Insufficient documentation

## 2012-08-12 DIAGNOSIS — Z8739 Personal history of other diseases of the musculoskeletal system and connective tissue: Secondary | ICD-10-CM | POA: Insufficient documentation

## 2012-08-12 DIAGNOSIS — F1910 Other psychoactive substance abuse, uncomplicated: Secondary | ICD-10-CM | POA: Insufficient documentation

## 2012-08-12 DIAGNOSIS — T401X1A Poisoning by heroin, accidental (unintentional), initial encounter: Secondary | ICD-10-CM | POA: Insufficient documentation

## 2012-08-12 DIAGNOSIS — Y929 Unspecified place or not applicable: Secondary | ICD-10-CM | POA: Insufficient documentation

## 2012-08-12 DIAGNOSIS — Y9389 Activity, other specified: Secondary | ICD-10-CM | POA: Insufficient documentation

## 2012-08-12 DIAGNOSIS — R110 Nausea: Secondary | ICD-10-CM | POA: Insufficient documentation

## 2012-08-12 DIAGNOSIS — F17200 Nicotine dependence, unspecified, uncomplicated: Secondary | ICD-10-CM | POA: Insufficient documentation

## 2012-08-12 DIAGNOSIS — T424X1A Poisoning by benzodiazepines, accidental (unintentional), initial encounter: Secondary | ICD-10-CM | POA: Insufficient documentation

## 2012-08-12 DIAGNOSIS — Z79899 Other long term (current) drug therapy: Secondary | ICD-10-CM | POA: Insufficient documentation

## 2012-08-12 DIAGNOSIS — R197 Diarrhea, unspecified: Secondary | ICD-10-CM | POA: Insufficient documentation

## 2012-08-12 DIAGNOSIS — F410 Panic disorder [episodic paroxysmal anxiety] without agoraphobia: Secondary | ICD-10-CM | POA: Insufficient documentation

## 2012-08-12 LAB — HEPATIC FUNCTION PANEL
AST: 19 U/L (ref 0–37)
Albumin: 4.5 g/dL (ref 3.5–5.2)
Alkaline Phosphatase: 55 U/L (ref 39–117)
Total Bilirubin: 0.4 mg/dL (ref 0.3–1.2)
Total Protein: 7.7 g/dL (ref 6.0–8.3)

## 2012-08-12 LAB — RAPID URINE DRUG SCREEN, HOSP PERFORMED
Amphetamines: NOT DETECTED
Barbiturates: NOT DETECTED
Cocaine: NOT DETECTED
Opiates: POSITIVE — AB
Tetrahydrocannabinol: NOT DETECTED

## 2012-08-12 MED ORDER — HYDROXYZINE HCL 25 MG PO TABS: 25.0000 mg | ORAL_TABLET | Freq: Four times a day (QID) | ORAL | Status: DC | PRN

## 2012-08-12 MED ORDER — ONDANSETRON HCL 4 MG/2ML IJ SOLN
4.0000 mg | Freq: Once | INTRAMUSCULAR | Status: AC
  Administered 2012-08-12: 4 mg via INTRAVENOUS
  Filled 2012-08-12: qty 2

## 2012-08-12 MED ORDER — LORAZEPAM 1 MG PO TABS
2.0000 mg | ORAL_TABLET | Freq: Two times a day (BID) | ORAL | Status: DC
  Administered 2012-08-12: 2 mg via ORAL
  Filled 2012-08-12: qty 2

## 2012-08-12 MED ORDER — ONDANSETRON 4 MG PO TBDP: 4.0000 mg | ORAL_TABLET | Freq: Four times a day (QID) | ORAL | Status: DC | PRN

## 2012-08-12 MED ORDER — METHOCARBAMOL 500 MG PO TABS
500.0000 mg | ORAL_TABLET | Freq: Three times a day (TID) | ORAL | Status: DC | PRN
  Administered 2012-08-12: 500 mg via ORAL
  Filled 2012-08-12: qty 1

## 2012-08-12 MED ORDER — CLONIDINE HCL 0.1 MG PO TABS: 0.1000 mg | ORAL_TABLET | ORAL | Status: DC

## 2012-08-12 MED ORDER — CLONIDINE HCL 0.1 MG PO TABS
0.1000 mg | ORAL_TABLET | Freq: Four times a day (QID) | ORAL | Status: DC
  Administered 2012-08-12: 0.1 mg via ORAL
  Filled 2012-08-12: qty 1

## 2012-08-12 MED ORDER — CLONIDINE HCL 0.1 MG PO TABS: 0.1000 mg | ORAL_TABLET | Freq: Every day | ORAL | Status: DC

## 2012-08-12 MED ORDER — LOPERAMIDE HCL 2 MG PO CAPS: 2.0000 mg | ORAL_CAPSULE | ORAL | Status: DC | PRN

## 2012-08-12 MED ORDER — DICYCLOMINE HCL 20 MG PO TABS
20.0000 mg | ORAL_TABLET | Freq: Four times a day (QID) | ORAL | Status: DC | PRN
  Administered 2012-08-12: 20 mg via ORAL
  Filled 2012-08-12: qty 1

## 2012-08-12 MED ORDER — NAPROXEN 250 MG PO TABS: 500.0000 mg | ORAL_TABLET | Freq: Two times a day (BID) | ORAL | Status: DC | PRN

## 2012-08-12 NOTE — ED Notes (Addendum)
arca given updated vitals. They will send transportation for pt at 330. Spoke with [employer]

## 2012-08-12 NOTE — ED Notes (Signed)
Pt medicated for body aches and complaint of anxiety and stomach cramps

## 2012-08-12 NOTE — ED Notes (Signed)
POD C 24 WHEN PT MEDICALLY CLEARED

## 2012-08-12 NOTE — ED Notes (Signed)
Pt undressed, in gown, on continuous pulse oximetry and blood pressure cuff; mother at bedisde

## 2012-08-12 NOTE — ED Notes (Signed)
Report given to Dustin Flock, RN

## 2012-08-12 NOTE — ED Notes (Signed)
Pt states he has relapsed for the past 2 months. States he has been using " about a bundle" (100 dollars a day) for the past 2 months. States he was clean for about 2 months prior to relapse and did go thru [facility] And spent [facility] days at [facility]. Pt lives at home with his mother. He does not pay rent. Mother states he "buys his own food, but i cook it for him". Patient does have a court date pending on April 23 for larceny.

## 2012-08-12 NOTE — ED Provider Notes (Signed)
History     CSN: [id number]  Arrival date & time 08/12/12  4098   First MD Initiated Contact with Patient 08/12/12 0825      Chief Complaint  Patient presents with  . Drug Overdose    (Consider location/radiation/quality/duration/timing/severity/associated sxs/prior treatment) Patient is a 32 y.o. male presenting with Overdose. The history is provided by the patient.  Drug Overdose Pertinent negatives include no chest pain, no abdominal pain, no headaches and no shortness of breath.   patient is requesting detox off of heroin and Xanax. He states he uses about a bundle of heroin a day and uses 4-6 mg of Xanax. He injects heroin. He last used last night before being seen he was long for overdose. Patient denies suicidal or homicidal thoughts. He would like treatment. He has had nausea without vomiting and is beginning to have diarrhea.  Past Medical History  Diagnosis Date  . Chronic back pain   . Drug abuse and dependence   . Anxiety   . Panic attack     Past Surgical History  Procedure Laterality Date  . Appendectomy      No family history on file.  History  Substance Use Topics  . Smoking status: Current Some Day Smoker  . Smokeless tobacco: Not on file  . Alcohol Use: No      Review of Systems  Constitutional: Negative for activity change and appetite change.  HENT: Negative for neck stiffness.   Eyes: Negative for pain.  Respiratory: Negative for chest tightness and shortness of breath.   Cardiovascular: Negative for chest pain and leg swelling.  Gastrointestinal: Positive for nausea and diarrhea. Negative for vomiting and abdominal pain.  Genitourinary: Negative for flank pain.  Musculoskeletal: Negative for back pain.  Skin: Negative for rash.  Neurological: Negative for weakness, numbness and headaches.  Psychiatric/Behavioral: Negative for suicidal ideas and behavioral problems. The patient is nervous/anxious.     Allergies  Ciprofloxacin hcl and  Flagyl  Home Medications   Current Outpatient Rx  Name  Route  Sig  Dispense  Refill  . ALPRAZolam (XANAX) 1 MG tablet   Oral   Take 1 mg by mouth 3 (three) times daily as needed for anxiety.           BP 118/67  Pulse 70  Temp(Src) 98.3 F (36.8 C) (Oral)  Resp 20  Ht 5\' 10"  (1.778 m)  Wt 180 lb (81.647 kg)  BMI 25.83 kg/m2  SpO2 98%  Physical Exam  Nursing note and vitals reviewed. Constitutional: He is oriented to person, place, and time. He appears well-developed and well-nourished.  HENT:  Head: Normocephalic and atraumatic.  Eyes: EOM are normal. Pupils are equal, round, and reactive to light.  Neck: Normal range of motion. Neck supple.  Cardiovascular: Normal rate, regular rhythm and normal heart sounds.   No murmur heard. Pulmonary/Chest: Effort normal and breath sounds normal.  Abdominal: Soft. Bowel sounds are normal. He exhibits no distension and no mass. There is no tenderness. There is no rebound and no guarding.  Musculoskeletal: Normal range of motion. He exhibits no edema.  Neurological: He is alert and oriented to person, place, and time. No cranial nerve deficit.  Skin: Skin is warm and dry.  Injection sites on bilateral forearms without abscesses or cellulitis  Psychiatric: He has a normal mood and affect.    ED Course  Procedures (including critical care time)  Labs Reviewed  URINE RAPID DRUG SCREEN (HOSP PERFORMED) - Abnormal; Notable for the  following:    Opiates POSITIVE (*)    Benzodiazepines POSITIVE (*)    All other components within normal limits  HEPATIC FUNCTION PANEL  ETHANOL   No results found.   1. Substance abuse       MDM  Patient requesting detox off opiates and benzos. Recently seen for overdose.. patient and accepted at [facility]. Rubin Payor, MD 08/15/12 [phone]

## 2012-08-12 NOTE — ED Notes (Signed)
Pt given resource guide for detox f/u

## 2012-08-12 NOTE — ED Notes (Signed)
Patient awakened from sleep to give med
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
## 2012-08-12 NOTE — ED Notes (Signed)
Requesting ativan== states has been taking xanax 2x a day, since 2001-- prescribed by Dr. Letta Median. Also states has been taking more than prescribed,

## 2012-08-12 NOTE — ED Notes (Signed)
Mother states just spent 90 days at [facility]-- got out in February

## 2012-08-12 NOTE — BH Assessment (Signed)
Assessment Note   Steven Arias is an 32 y.o. male that presents to [facility] requesting detox for heroin.  Pt presented to [facility] last night, was sent home with outpatient resources, and reports using 4-5 bags IV heroin daily x 2 mos as well as taking 4-5 of his prescribed 1 mg Xanax from his psychiatrist.  Pt stated he went to [facility] this AM and was told to come to [facility] for med clearance and to detox.  Pt's mother present with the pt.  Per the mother, she found pt in bed snoring with a syringe beside him and EMS took to [facility].  Pt stated he had been sober for 90 days before he relapsed 2 mos ago.  Pt stated he has had both inpatient and outpatient SA detox and treatment in the past.  Pt was last at the [facility] in January 2014.  Pt reports current withdrawal sx include nausea, body aches and anxiety.  Pt calm, cooperative.  Pt denies hx of seizures.  Pt endorses sx of depression, but denies SI/HI or psychosis.  Called [facility], spoke with [facility] @ 1233, who stated a possible bed is available.  Completed assessment and faxed over for review.  Updated ED staff. Axis I: 304.00 Opioid Dependence, 311 Depressive Disorder NOS Axis II: Deferred Axis III:  Past Medical History  Diagnosis Date  . Chronic back pain   . Drug abuse and dependence   . Anxiety   . Panic attack    Axis IV: economic problems, housing problems, occupational problems, other psychosocial or environmental problems, problems related to legal system/crime, problems related to social environment and problems with access to health care services Axis V: 31-40 impairment in reality testing  Past Medical History:  Past Medical History  Diagnosis Date  . Chronic back pain   . Drug abuse and dependence   . Anxiety   . Panic attack     Past Surgical History  Procedure Laterality Date  . Appendectomy      Family History: No family history on file.  Social History:  reports that he has been smoking.  He does not have any smokeless tobacco  history on file. He reports that he uses illicit drugs (Opium, Benzodiazepines, and IV). He reports that he does not drink alcohol.  Additional Social History:  Alcohol / Drug Use Pain Medications: see MAR Prescriptions: see MAR Over the Counter: see MAR History of alcohol / drug use?: Yes Longest period of sobriety (when/how long): 90 days in 2013 Negative Consequences of Use: Financial;Legal;Personal relationships;Work / Programmer, multimedia Withdrawal Symptoms: Agitation;Tremors;Nausea / Vomiting Substance #1 Name of Substance 1: Heroin 1 - Age of First Use: 20's 1 - Amount (size/oz): 1 bundle 1 - Frequency: daily 1 - Duration: ongoing for years - relapsed 2 mos ago 1 - Last Use / Amount: 08/11/12 - 1 gram Substance #2 Name of Substance 2: Xanax 2 - Age of First Use: 20's 2 - Amount (size/oz): 4-5 mg 2 - Frequency: daily 2 - Duration: ongoing for years, relapsed 2 mos ago 2 - Last Use / Amount: 08/11/12 - 5 mg  CIWA: CIWA-Ar BP: 114/57 mmHg Pulse Rate: 56 COWS: Clinical Opiate Withdrawal Scale (COWS) Resting Pulse Rate: Pulse Rate 80 or below Sweating: Subjective report of chills or flushing Restlessness: Able to sit still Pupil Size: Pupils pinned or normal size for room light Bone or Joint Aches: Patient reports sever diffuse aching of joints/muscles Runny Nose or Tearing: Not present GI Upset: Stomach cramps Tremor:  No tremor Yawning: No yawning Anxiety or Irritability: Patient reports increasing irritability or anxiousness Gooseflesh Skin: Skin is smooth COWS Total Score: 5  Allergies:  Allergies  Allergen Reactions  . Ciprofloxacin Hcl Hives and Rash  . Flagyl (Metronidazole Hcl) Hives and Rash    Home Medications:  (Not in a hospital admission)  OB/GYN Status:  No LMP for male patient.  General Assessment Data Location of Assessment: [facility] ED Living Arrangements: Parent Can pt return to current living arrangement?: Yes Admission Status: Voluntary Is patient capable of  signing voluntary admission?: Yes Transfer from: Acute Hospital Referral Source: Self/Family/Friend  Education Status Is patient currently in school?: No Highest grade of school patient has completed: 12  Risk to self Suicidal Ideation: No Suicidal Intent: No Is patient at risk for suicide?: No Suicidal Plan?: No Access to Means: No What has been your use of drugs/alcohol within the last 12 months?: Daily use of heroin and Xanax Previous Attempts/Gestures: No How many times?: 0 Other Self Harm Risks: pt denies Triggers for Past Attempts: None known Intentional Self Injurious Behavior: Damaging (Ongoing SA) Comment - Self Injurious Behavior: Ongoing SA Family Suicide History: No Recent stressful life event(s): Other (Comment);Financial Problems;Legal Issues;Recent negative physical changes (Recent relapse, upcoming court date) Persecutory voices/beliefs?: No Depression: Yes Depression Symptoms: Despondent;Insomnia;Fatigue;Loss of interest in usual pleasures;Feeling worthless/self pity Substance abuse history and/or treatment for substance abuse?: Yes Suicide prevention information given to non-admitted patients: Yes  Risk to Others Homicidal Ideation: No Thoughts of Harm to Others: No Current Homicidal Intent: No Current Homicidal Plan: No Access to Homicidal Means: No Identified Victim: pt denies History of harm to others?: No Assessment of Violence: None Noted Violent Behavior Description: na - pt calm cooperative Does patient have access to weapons?: No Criminal Charges Pending?: Yes Describe Pending Criminal Charges: larceny Does patient have a court date: Yes Court Date: 08/27/12  Psychosis Hallucinations: None noted Delusions: None noted  Mental Status Report Appear/Hygiene: Disheveled Eye Contact: Poor Motor Activity: Freedom of movement;Unremarkable Speech: Logical/coherent Level of Consciousness: Quiet/awake Mood: Depressed;Anxious Affect:  Anxious;Depressed Anxiety Level: Moderate Thought Processes: Coherent;Relevant Judgement: Impaired Orientation: Person;Place;Time;Situation Obsessive Compulsive Thoughts/Behaviors: None  Cognitive Functioning Concentration: Decreased Memory: Recent Intact;Remote Intact IQ: Average Insight: Poor Impulse Control: Poor Appetite: Poor Weight Loss:  (15-20 lbs in last 30 days) Weight Gain: 0 Sleep: Decreased Total Hours of Sleep:  ("It's erratic") Vegetative Symptoms: None  ADLScreening [facility] Assessment Services) Patient's cognitive ability adequate to safely complete daily activities?: Yes Patient able to express need for assistance with ADLs?: Yes Independently performs ADLs?: Yes (appropriate for developmental age)  Abuse/Neglect [facility]) Physical Abuse: Denies Verbal Abuse: Denies Sexual Abuse: Denies  Prior Inpatient Therapy Prior Inpatient Therapy: Yes Prior Therapy Dates: 2007, 2013, 2014 Prior Therapy Facilty/Provider(s): [facility], [facility], [facility] Reason for Treatment: SA/detox  Prior Outpatient Therapy Prior Outpatient Therapy: Yes Prior Therapy Dates: 2001-current Prior Therapy Facilty/Provider(s): Dr. Sheran Luz Reason for Treatment: Depression/Anxiety  ADL Screening (condition at time of admission) Patient's cognitive ability adequate to safely complete daily activities?: Yes Patient able to express need for assistance with ADLs?: Yes Independently performs ADLs?: Yes (appropriate for developmental age)  Home Assistive Devices/Equipment Home Assistive Devices/Equipment: None    Abuse/Neglect Assessment (Assessment to be complete while patient is alone) Physical Abuse: Denies Verbal Abuse: Denies Sexual Abuse: Denies Exploitation of patient/patient's resources: Denies Self-Neglect: Denies Values / Beliefs Cultural Requests During Hospitalization: None Spiritual Requests During Hospitalization: None Consults Spiritual Care Consult Needed:  No Social Work  Consult Needed: No Advance Directives (For Healthcare) Advance Directive: Patient does not have advance directive;Patient would not like information    Additional Information 1:1 In Past 12 Months?: No CIRT Risk: No Elopement Risk: No Does patient have medical clearance?: Yes     Disposition:  Disposition Initial Assessment Completed for this Encounter: Yes Disposition of Patient: Referred to;Inpatient treatment program Type of inpatient treatment program: Adult Patient referred to: [facility]  On Site Evaluation by:   Reviewed with Physician:  Thornton Papas, Rennis Harding 08/12/2012 12:26 PM

## 2012-08-12 NOTE — ED Notes (Signed)
MD at bedside. 

## 2012-08-12 NOTE — ED Notes (Addendum)
PATIENT HAS LEFT ENROUTE TO [facility] WITH HIS MOTHER. REPORT GIVEN TO CHRIS AT [facility]

## 2012-08-12 NOTE — BH Assessment (Signed)
Assessment Note  Update:  Received notification from pt's nurse that there was a possible detox bed at [facility].  Called [facility] and confirmed this with Olegario Messier and referral faxed for review.  Olegario Messier spoke with Clinical research associate and pt's nurse and pt accepted [facility] @ 1455.  EDP [place] notified.  [facility] transportation is here to transport pt to [facility] and pt to be [facility]    Disposition:  Disposition Initial Assessment Completed for this Encounter: Yes Disposition of Patient: Inpatient treatment program Type of inpatient treatment program: Adult Patient referred to: [facility]  On Site Evaluation by:   Reviewed with Physician:  Thornton Papas, Rennis Harding 08/12/2012 2:34 PM

## 2012-08-12 NOTE — ED Notes (Signed)
Act team at bedside to eval pt

## 2012-08-12 NOTE — ED Notes (Signed)
Pt overdosed on heroin last night and then presented to [facility] for treatment.  Pt d/c'd from [facility] early this morning and now presents here for help with detox.  Pt's mother at the bedside.

## 2012-09-11 ENCOUNTER — Encounter (HOSPITAL_BASED_OUTPATIENT_CLINIC_OR_DEPARTMENT_OTHER): Payer: Self-pay | Admitting: *Deleted

## 2012-09-11 ENCOUNTER — Emergency Department (HOSPITAL_BASED_OUTPATIENT_CLINIC_OR_DEPARTMENT_OTHER)
Admission: EM | Admit: 2012-09-11 | Discharge: 2012-09-11 | Disposition: A | Discharge status: Home / Self Care | Payer: Self-pay | Attending: Emergency Medicine | Admitting: Emergency Medicine

## 2012-09-11 DIAGNOSIS — IMO0001 Reserved for inherently not codable concepts without codable children: Secondary | ICD-10-CM | POA: Insufficient documentation

## 2012-09-11 DIAGNOSIS — Y929 Unspecified place or not applicable: Secondary | ICD-10-CM | POA: Insufficient documentation

## 2012-09-11 DIAGNOSIS — F410 Panic disorder [episodic paroxysmal anxiety] without agoraphobia: Secondary | ICD-10-CM | POA: Insufficient documentation

## 2012-09-11 DIAGNOSIS — Z79899 Other long term (current) drug therapy: Secondary | ICD-10-CM | POA: Insufficient documentation

## 2012-09-11 DIAGNOSIS — F17200 Nicotine dependence, unspecified, uncomplicated: Secondary | ICD-10-CM | POA: Insufficient documentation

## 2012-09-11 DIAGNOSIS — S50862A Insect bite (nonvenomous) of left forearm, initial encounter: Secondary | ICD-10-CM

## 2012-09-11 DIAGNOSIS — F1920 Other psychoactive substance dependence, uncomplicated: Secondary | ICD-10-CM | POA: Insufficient documentation

## 2012-09-11 DIAGNOSIS — Y939 Activity, unspecified: Secondary | ICD-10-CM | POA: Insufficient documentation

## 2012-09-11 DIAGNOSIS — R21 Rash and other nonspecific skin eruption: Secondary | ICD-10-CM | POA: Insufficient documentation

## 2012-09-11 DIAGNOSIS — Z8739 Personal history of other diseases of the musculoskeletal system and connective tissue: Secondary | ICD-10-CM | POA: Insufficient documentation

## 2012-09-11 MED ORDER — TRAMADOL HCL 50 MG PO TABS: 50.0000 mg | ORAL_TABLET | Freq: Four times a day (QID) | ORAL | Status: DC | PRN

## 2012-09-11 MED ORDER — SULFAMETHOXAZOLE-TRIMETHOPRIM 800-160 MG PO TABS: 1.0000 | ORAL_TABLET | Freq: Two times a day (BID) | ORAL | Status: DC

## 2012-09-11 NOTE — ED Provider Notes (Signed)
History     CSN: [id number]  Arrival date & time 09/11/12  1154   First MD Initiated Contact with Patient 09/11/12 1218      Chief Complaint  Patient presents with  . Insect Bite    (Consider location/radiation/quality/duration/timing/severity/associated sxs/prior treatment) Patient is a 32 y.o. male presenting with rash. The history is provided by the patient. No language interpreter was used.  Rash Location:  Shoulder/arm Shoulder/arm rash location:  L arm Quality: blistering   Severity:  Moderate Timing:  Constant Progression:  Worsening Relieved by:  Nothing Ineffective treatments:  None tried Pt thinks something bit him.  Pt reports he has a red swollen area on left forearm.  Past Medical History  Diagnosis Date  . Chronic back pain   . Drug abuse and dependence   . Anxiety   . Panic attack     Past Surgical History  Procedure Laterality Date  . Appendectomy      No family history on file.  History  Substance Use Topics  . Smoking status: Current Some Day Smoker -- 0.50 packs/day    Types: Cigarettes  . Smokeless tobacco: Not on file  . Alcohol Use: No      Review of Systems  Skin: Positive for rash.  All other systems reviewed and are negative.    Allergies  Ciprofloxacin hcl and Flagyl  Home Medications   Current Outpatient Rx  Name  Route  Sig  Dispense  Refill  . diphenhydrAMINE (BENADRYL) 50 MG tablet   Oral   Take 50 mg by mouth at bedtime as needed for itching.         Marland Kitchen ibuprofen (ADVIL,MOTRIN) 600 MG tablet   Oral   Take 600 mg by mouth every 6 (six) hours as needed for pain.         . mirtazapine (REMERON) 15 MG tablet   Oral   Take 15 mg by mouth at bedtime.         . ALPRAZolam (XANAX) 1 MG tablet   Oral   Take 1 mg by mouth 3 (three) times daily as needed for anxiety.         . sulfamethoxazole-trimethoprim (SEPTRA DS) 800-160 MG per tablet   Oral   Take 1 tablet by mouth 2 (two) times daily.   20 tablet    0   . traMADol (ULTRAM) 50 MG tablet   Oral   Take 1 tablet (50 mg total) by mouth every 6 (six) hours as needed for pain.   15 tablet   0     BP 120/79  Pulse 73  Temp(Src) 98.4 F (36.9 C) (Oral)  Resp 14  SpO2 100%  Physical Exam  Nursing note and vitals reviewed. Constitutional: He is oriented to person, place, and time. He appears well-developed.  HENT:  Head: Normocephalic.  Musculoskeletal: He exhibits tenderness.  2cm round area, small area of enduration in center,    Neurological: He is alert and oriented to person, place, and time. He has normal reflexes.  Skin: Skin is warm.  Psychiatric: He has a normal mood and affect.    ED Course  Procedures (including critical care time)  Labs Reviewed - No data to display No results found.   1. Insect bite of left forearm, initial encounter       MDM  Pt counseled on skin infection,   Pt given rs for tramadol and bactrim        Elson Areas, PA-C 09/11/12  1333 

## 2012-09-11 NOTE — ED Provider Notes (Signed)
Medical screening examination/treatment/procedure(s) were performed by non-physician practitioner and as supervising physician I was immediately available for consultation/collaboration.   Chanae Gemma B. Bernette Mayers, MD 09/11/12 [phone]

## 2012-09-11 NOTE — ED Notes (Signed)
Patient states he got out of the shower yesterday afternoon and noticed a red bump on his left forearm.  States this morning he woke up and has increased pain, swelling and redness.  Using tylenol and benadryl with no relief.

## 2012-11-08 ENCOUNTER — Emergency Department (HOSPITAL_COMMUNITY)
Admission: EM | Admit: 2012-11-08 | Discharge: 2012-11-08 | Disposition: A | Discharge status: Home / Self Care | Payer: Self-pay | Attending: Emergency Medicine | Admitting: Emergency Medicine

## 2012-11-08 ENCOUNTER — Emergency Department (HOSPITAL_COMMUNITY): Payer: Self-pay

## 2012-11-08 ENCOUNTER — Encounter (HOSPITAL_COMMUNITY): Payer: Self-pay | Admitting: Emergency Medicine

## 2012-11-08 DIAGNOSIS — Y9389 Activity, other specified: Secondary | ICD-10-CM | POA: Insufficient documentation

## 2012-11-08 DIAGNOSIS — F17200 Nicotine dependence, unspecified, uncomplicated: Secondary | ICD-10-CM | POA: Insufficient documentation

## 2012-11-08 DIAGNOSIS — S61412A Laceration without foreign body of left hand, initial encounter: Secondary | ICD-10-CM

## 2012-11-08 DIAGNOSIS — Z8659 Personal history of other mental and behavioral disorders: Secondary | ICD-10-CM | POA: Insufficient documentation

## 2012-11-08 DIAGNOSIS — Y929 Unspecified place or not applicable: Secondary | ICD-10-CM | POA: Insufficient documentation

## 2012-11-08 DIAGNOSIS — G8929 Other chronic pain: Secondary | ICD-10-CM | POA: Insufficient documentation

## 2012-11-08 DIAGNOSIS — W260XXA Contact with knife, initial encounter: Secondary | ICD-10-CM | POA: Insufficient documentation

## 2012-11-08 DIAGNOSIS — S61409A Unspecified open wound of unspecified hand, initial encounter: Secondary | ICD-10-CM | POA: Insufficient documentation

## 2012-11-08 MED ORDER — IBUPROFEN 800 MG PO TABS
800.0000 mg | ORAL_TABLET | Freq: Once | ORAL | Status: AC
  Administered 2012-11-08: 800 mg via ORAL
  Filled 2012-11-08: qty 1

## 2012-11-08 NOTE — ED Notes (Signed)
Pt has 1cm puncture wound to left hand. Pt did it with a knife trying to separated frozen hamburger patties. Pt reports pain 7/10. Bleeding controlled.

## 2012-11-08 NOTE — ED Provider Notes (Signed)
   History    CSN: [id number] Arrival date & time 11/08/12  1345  First MD Initiated Contact with Patient 11/08/12 1423     No chief complaint on file.  (Consider location/radiation/quality/duration/timing/severity/associated sxs/prior Treatment) HPI Comments: Patient states he was prying frozen hamburgers apart when the knife slipped and stabbed him in the left palmar thumb.  States he thinks the knife hit bone.  Denies weakness, numbness of the hand or difficulty moving the hand.  Denies other injury.   The history is provided by the patient.   Past Medical History  Diagnosis Date  . Chronic back pain   . Drug abuse and dependence   . Anxiety   . Panic attack    Past Surgical History  Procedure Laterality Date  . Appendectomy     No family history on file. History  Substance Use Topics  . Smoking status: Current Some Day Smoker -- 0.50 packs/day    Types: Cigarettes  . Smokeless tobacco: Not on file  . Alcohol Use: No    Review of Systems  Skin: Positive for wound.  Neurological: Negative for weakness and numbness.    Allergies  Ciprofloxacin hcl and Flagyl  Home Medications  No current outpatient prescriptions on file. BP 122/77  Pulse 76  Temp(Src) 98.3 F (36.8 C) (Oral)  Resp 18  SpO2 100% Physical Exam  Nursing note and vitals reviewed. Constitutional: He appears well-developed and well-nourished. No distress.  HENT:  Head: Normocephalic and atraumatic.  Neck: Neck supple.  Pulmonary/Chest: Effort normal.  Musculoskeletal:       Hands: Neurological: He is alert.  Skin: He is not diaphoretic.    ED Course  Procedures (including critical care time) Labs Reviewed - No data to display No results found.  3:36 PM Pt declines sutures after local anaesthesia administered.  Pt is shaking and anxious and states he cannot take sutures.  He does not like the idea of it and states he cannot tolerate it.  I discussed with him the risks of not getting  sutures.  He verbalizes understanding. Tetanus was updated 3 years ago.   1. Hand laceration, left, initial encounter     MDM  Pt with laceration to left hand, palmar aspect at base of thumb.  No tendon involvement.  Neurovascularly intact.  Pt declines sutures after discussion about increased risk of infection, bleeding, etc.  Discussed all results with patient.  Pt given return precautions.  Pt verbalizes understanding and agrees with plan.     [place], PA-C 11/08/12 [phone]

## 2012-11-08 NOTE — ED Provider Notes (Signed)
Medical screening examination/treatment/procedure(s) were performed by non-physician practitioner and as supervising physician I was immediately available for consultation/collaboration.  Doug Sou, MD 11/08/12 1616

## 2014-01-15 ENCOUNTER — Encounter (HOSPITAL_COMMUNITY): Payer: Self-pay | Admitting: Emergency Medicine

## 2014-01-15 ENCOUNTER — Emergency Department (HOSPITAL_COMMUNITY): Payer: Self-pay

## 2014-01-15 ENCOUNTER — Emergency Department (HOSPITAL_COMMUNITY)
Admission: EM | Admit: 2014-01-15 | Discharge: 2014-01-15 | Disposition: A | Discharge status: Home / Self Care | Payer: Self-pay | Attending: Emergency Medicine | Admitting: Emergency Medicine

## 2014-01-15 DIAGNOSIS — D1739 Benign lipomatous neoplasm of skin and subcutaneous tissue of other sites: Secondary | ICD-10-CM | POA: Insufficient documentation

## 2014-01-15 DIAGNOSIS — Z8659 Personal history of other mental and behavioral disorders: Secondary | ICD-10-CM | POA: Insufficient documentation

## 2014-01-15 DIAGNOSIS — F1210 Cannabis abuse, uncomplicated: Secondary | ICD-10-CM | POA: Insufficient documentation

## 2014-01-15 DIAGNOSIS — F17200 Nicotine dependence, unspecified, uncomplicated: Secondary | ICD-10-CM | POA: Insufficient documentation

## 2014-01-15 DIAGNOSIS — G8929 Other chronic pain: Secondary | ICD-10-CM | POA: Insufficient documentation

## 2014-01-15 DIAGNOSIS — D171 Benign lipomatous neoplasm of skin and subcutaneous tissue of trunk: Secondary | ICD-10-CM

## 2014-01-15 DIAGNOSIS — M549 Dorsalgia, unspecified: Secondary | ICD-10-CM | POA: Insufficient documentation

## 2014-01-15 LAB — CBC WITH DIFFERENTIAL/PLATELET
BASOS ABS: 0.1 10*3/uL (ref 0.0–0.1)
Basophils Relative: 2 % — ABNORMAL HIGH (ref 0–1)
Eosinophils Absolute: 0.2 10*3/uL (ref 0.0–0.7)
Eosinophils Relative: 3 % (ref 0–5)
HEMATOCRIT: 43.5 % (ref 39.0–52.0)
HEMOGLOBIN: 14.3 g/dL (ref 13.0–17.0)
LYMPHS PCT: 39 % (ref 12–46)
Lymphs Abs: 2 10*3/uL (ref 0.7–4.0)
MCH: 31 pg (ref 26.0–34.0)
MCHC: 32.9 g/dL (ref 30.0–36.0)
MCV: 94.2 fL (ref 78.0–100.0)
MONO ABS: 0.7 10*3/uL (ref 0.1–1.0)
Monocytes Relative: 14 % — ABNORMAL HIGH (ref 3–12)
NEUTROS ABS: 2.2 10*3/uL (ref 1.7–7.7)
NEUTROS PCT: 42 % — AB (ref 43–77)
Platelets: 222 10*3/uL (ref 150–400)
RBC: 4.62 MIL/uL (ref 4.22–5.81)
RDW: 14.1 % (ref 11.5–15.5)
WBC: 5.2 10*3/uL (ref 4.0–10.5)

## 2014-01-15 LAB — URINALYSIS, ROUTINE W REFLEX MICROSCOPIC
BILIRUBIN URINE: NEGATIVE
GLUCOSE, UA: NEGATIVE mg/dL
HGB URINE DIPSTICK: NEGATIVE
KETONES UR: NEGATIVE mg/dL
Leukocytes, UA: NEGATIVE
Nitrite: NEGATIVE
PH: 6 (ref 5.0–8.0)
PROTEIN: NEGATIVE mg/dL
Specific Gravity, Urine: 1.02 (ref 1.005–1.030)
Urobilinogen, UA: 0.2 mg/dL (ref 0.0–1.0)

## 2014-01-15 LAB — RAPID URINE DRUG SCREEN, HOSP PERFORMED
AMPHETAMINES: NOT DETECTED
BENZODIAZEPINES: NOT DETECTED
Barbiturates: NOT DETECTED
COCAINE: NOT DETECTED
OPIATES: NOT DETECTED
Tetrahydrocannabinol: POSITIVE — AB

## 2014-01-15 LAB — BASIC METABOLIC PANEL
ANION GAP: 13 (ref 5–15)
BUN: 10 mg/dL (ref 6–23)
CHLORIDE: 101 meq/L (ref 96–112)
CO2: 27 meq/L (ref 19–32)
CREATININE: 0.99 mg/dL (ref 0.50–1.35)
Calcium: 9.1 mg/dL (ref 8.4–10.5)
GFR calc Af Amer: >90 mL/min (ref >90)
GFR calc non Af Amer: >90 mL/min (ref >90)
Glucose, Bld: 95 mg/dL (ref 70–99)
POTASSIUM: 4 meq/L (ref 3.7–5.3)
Sodium: 141 mEq/L (ref 137–147)

## 2014-01-15 LAB — CBG MONITORING, ED: GLUCOSE-CAPILLARY: 70 mg/dL (ref 70–99)

## 2014-01-15 MED ORDER — IOHEXOL 300 MG/ML  SOLN
80.0000 mL | Freq: Once | INTRAMUSCULAR | Status: AC | PRN
Start: 2014-01-15 — End: 2014-01-15
  Administered 2014-01-15: 80 mL via INTRAVENOUS

## 2014-01-15 MED ORDER — NAPROXEN 500 MG PO TABS: 500.0000 mg | ORAL_TABLET | Freq: Two times a day (BID) | ORAL | Status: DC

## 2014-01-15 MED ORDER — HYDROCODONE-ACETAMINOPHEN 5-325 MG PO TABS: ORAL_TABLET | ORAL | Status: DC

## 2014-01-15 MED ORDER — ONDANSETRON 8 MG PO TBDP
8.0000 mg | ORAL_TABLET | Freq: Once | ORAL | Status: AC
  Administered 2014-01-15: 8 mg via ORAL
  Filled 2014-01-15: qty 1

## 2014-01-15 NOTE — Discharge Instructions (Signed)
Lipoma  A lipoma is a noncancerous (benign) tumor composed of fat cells. They are usually found under the skin (subcutaneous). A lipoma may occur in any tissue of the body that contains fat. Common areas for lipomas to appear include the back, shoulders, buttocks, and thighs. Lipomas are a very common soft tissue growth. They are soft and grow slowly. Most problems caused by a lipoma depend on where it is growing.  DIAGNOSIS   A lipoma can be diagnosed with a physical exam. These tumors rarely become cancerous, but radiographic studies can help determine this for certain. Studies used may include:  · Computerized X-ray scans (CT or CAT scan).  · Computerized magnetic scans (MRI).  TREATMENT   Small lipomas that are not causing problems may be watched. If a lipoma continues to enlarge or causes problems, removal is often the best treatment. Lipomas can also be removed to improve appearance. Surgery is done to remove the fatty cells and the surrounding capsule. Most often, this is done with medicine that numbs the area (local anesthetic). The removed tissue is examined under a microscope to make sure it is not cancerous. Keep all follow-up appointments with your caregiver.  SEEK MEDICAL CARE IF:   · The lipoma becomes larger or hard.  · The lipoma becomes painful, red, or increasingly swollen. These could be signs of infection or a more serious condition.  Document Released: 04/13/2002 Document Revised: 07/16/2011 Document Reviewed: 09/23/2009  Exit[employer]® Patient Information ©2015 [employer]. This information is not intended to replace advice given to you by your health care provider. Make sure you discuss any questions you have with your health care provider.

## 2014-01-15 NOTE — ED Notes (Signed)
Pt states he has a "bump" that appeared a month ago on his back. Pt states it is beginning to cause him back pain.

## 2014-01-15 NOTE — ED Provider Notes (Signed)
CSN: [id number]     Arrival date & time 01/15/14  2008 History   First MD Initiated Contact with Patient 01/15/14 2106     Chief Complaint  Patient presents with  . Back Pain     (Consider location/radiation/quality/duration/timing/severity/associated sxs/prior Treatment) HPI   Steven Arias is a 33 y.o. male who presents to the Emergency Department complaining of pain to an area of his right middle back for one month.  He states that he noticed a small "knot" to that area  That has recently became larger and now painful with palpation and with lying down.  He denies numbness, weakness, fever, or chills.  He has tried OTC medications w/o relief.   Past Medical History  Diagnosis Date  . Chronic back pain   . Drug abuse and dependence   . Anxiety   . Panic attack    Past Surgical History  Procedure Laterality Date  . Appendectomy     No family history on file. History  Substance Use Topics  . Smoking status: Current Some Day Smoker -- 0.50 packs/day    Types: Cigarettes  . Smokeless tobacco: Not on file  . Alcohol Use: No    Review of Systems  Constitutional: Negative for fever.  Respiratory: Negative for shortness of breath.   Gastrointestinal: Negative for vomiting, abdominal pain and constipation.  Genitourinary: Negative for dysuria, hematuria, flank pain, decreased urine volume and difficulty urinating.  Musculoskeletal: Positive for back pain. Negative for joint swelling.  Skin: Negative for rash.       'knot" to right back  Neurological: Negative for weakness and numbness.  All other systems reviewed and are negative.     Allergies  Ciprofloxacin hcl and Flagyl  Home Medications   Prior to Admission medications   Not on File   BP 127/73  Pulse 61  Temp(Src) 98.5 F (36.9 C) (Oral)  Resp 20  Ht 5\' 10"  (1.778 m)  Wt 183 lb (83.008 kg)  BMI 26.26 kg/m2  SpO2 100% Physical Exam  Nursing note and vitals reviewed. Constitutional: He is oriented to  person, place, and time. He appears well-developed and well-nourished. No distress.  HENT:  Head: Normocephalic and atraumatic.  Neck: Normal range of motion. Neck supple.  Cardiovascular: Normal rate, regular rhythm, normal heart sounds and intact distal pulses.   No murmur heard. Pulmonary/Chest: Effort normal and breath sounds normal. No respiratory distress. He exhibits no tenderness.  Abdominal: Soft. He exhibits no distension. There is no tenderness.  Musculoskeletal: He exhibits tenderness. He exhibits no edema.       Lumbar back: He exhibits tenderness and pain. He exhibits normal range of motion, no swelling, no deformity, no laceration and normal pulse.  Localized ttp of the right mid back just inferior to the scapular border.  No spinal tenderness.  Radial pulses are brisk and symmetrical.  Distal sensation intact.  Hip Flexors/Extensors are intact.  Pt has 5/5 strength against resistance of bilateral upper and lower extremities.     Neurological: He is alert and oriented to person, place, and time. He has normal strength. No sensory deficit. He exhibits normal muscle tone. Coordination and gait normal.  Reflex Scores:      Patellar reflexes are 2+ on the right side and 2+ on the left side.      Achilles reflexes are 2+ on the right side and 2+ on the left side. Skin: Skin is warm and dry. No rash noted.    ED Course  Procedures (  including critical care time) Labs Review Labs Reviewed  CBC WITH DIFFERENTIAL - Abnormal; Notable for the following:    Neutrophils Relative % 42 (*)    Monocytes Relative 14 (*)    Basophils Relative 2 (*)    All other components within normal limits  BASIC METABOLIC PANEL  URINE RAPID DRUG SCREEN (HOSP PERFORMED)  URINALYSIS, ROUTINE W REFLEX MICROSCOPIC  CBG MONITORING, ED    Imaging Review Ct Chest W Contrast  01/15/2014   CLINICAL DATA:  Back pain and mass.  EXAM: CT CHEST WITH CONTRAST  TECHNIQUE: Multidetector CT imaging of the chest  was performed during intravenous contrast administration.  CONTRAST:  83mL OMNIPAQUE IOHEXOL 300 MG/ML  SOLN  COMPARISON:  CT thoracic spine 10/08/2011  FINDINGS: The chest wall is unremarkable. No supraclavicular or axillary mass or adenopathy. The patient's palpable abnormality is marked on the skin and this appears to correspond to a simple subcutaneous lipoma. No worrisome CT imaging features. The bony structures are unremarkable.  The heart and great vessels are normal. The lungs are clear. The upper abdomen is unremarkable.  IMPRESSION: Small subcutaneous lipoma.  Normal CT examination of the chest.   Electronically Signed   By: Kalman Jewels M.D.   On: 01/15/2014 23:30     EKG Interpretation None      MDM   Final diagnoses:  Lipoma of back    During my initial exam, I was standing behind the patient, he was sitting upright on the stretcher as I palpated along the right thoracic region in the area of the mass, patient reported feeling nauseous and began to lean forward and fell forward off the stretcher.  I grabbed his pants to help break his fall.  Patient's family member and myself assisted patient back onto the stretcher placed in supine position and he immediately responded to me when I called his name.  He appeared pale, but was alert, oriented and answered questions appropriately.  Vitals were checked, CBG 70, no seizure activity witnessed.  Pt reports feeling better within several minutes.  Likely vasovagal response   On review of patient's chart, it was noted that patient has h/o IV heroin use and benzos  admission in 2014 after overdose.  Pt admits to recent marijuana use but denies IV drug use, alcohol, cocaine or benzos.     I have discussed pt hx and incident with Dr. Roderic Palau who has also evaluated the patient.  Recommends CT with contrast, labs also ordered.  Pt drinking fluids and has since ambulated in the dept w/o difficulty and has been talking on the telephone.  CT  confirms lipoma.  Pt is feeling better and appears stable for d/c.  Given referral for general surgery, rx for vicodin and naprosyn .  He was also given strict return precautions.    Gavon Majano L. Vanessa Coleman, PA-C 01/18/14 3220

## 2014-01-15 NOTE — ED Notes (Signed)
During Steven Arias assessment patient became dizzy, faint feeling. VSS. Glucose checked.

## 2014-01-18 NOTE — ED Provider Notes (Signed)
Medical screening examination/treatment/procedure(s) were performed by non-physician practitioner and as supervising physician I was immediately available for consultation/collaboration.   EKG Interpretation None      Pt with mass on upper back.  pe tender small mass to upper back  Maudry Diego, MD 01/18/14 1148

## 2014-05-14 ENCOUNTER — Emergency Department (HOSPITAL_COMMUNITY)
Admission: EM | Admit: 2014-05-14 | Discharge: 2014-05-14 | Disposition: A | Discharge status: Home / Self Care | Payer: Self-pay | Attending: Emergency Medicine | Admitting: Emergency Medicine

## 2014-05-14 ENCOUNTER — Encounter (HOSPITAL_COMMUNITY): Payer: Self-pay | Admitting: Emergency Medicine

## 2014-05-14 DIAGNOSIS — F419 Anxiety disorder, unspecified: Secondary | ICD-10-CM | POA: Insufficient documentation

## 2014-05-14 DIAGNOSIS — Z720 Tobacco use: Secondary | ICD-10-CM | POA: Insufficient documentation

## 2014-05-14 DIAGNOSIS — D1730 Benign lipomatous neoplasm of skin and subcutaneous tissue of unspecified sites: Secondary | ICD-10-CM | POA: Insufficient documentation

## 2014-05-14 DIAGNOSIS — Z791 Long term (current) use of non-steroidal anti-inflammatories (NSAID): Secondary | ICD-10-CM | POA: Insufficient documentation

## 2014-05-14 DIAGNOSIS — G8929 Other chronic pain: Secondary | ICD-10-CM | POA: Insufficient documentation

## 2014-05-14 DIAGNOSIS — D179 Benign lipomatous neoplasm, unspecified: Secondary | ICD-10-CM

## 2014-05-14 MED ORDER — TRAMADOL HCL 50 MG PO TABS: 50.0000 mg | ORAL_TABLET | Freq: Four times a day (QID) | ORAL | Status: DC | PRN

## 2014-05-14 MED ORDER — IBUPROFEN 600 MG PO TABS: 600.0000 mg | ORAL_TABLET | Freq: Four times a day (QID) | ORAL | Status: DC | PRN

## 2014-05-14 NOTE — ED Notes (Signed)
Per pt, states possible lipoma on left side of upper back-noticed it 2 months ago-was seen at [facility] to afford surgeon

## 2014-05-14 NOTE — ED Provider Notes (Signed)
CSN: [id number]     Arrival date & time 05/14/14  6761 History   First MD Initiated Contact with Patient 05/14/14 1004     Chief Complaint  Patient presents with  . bump on back      (Consider location/radiation/quality/duration/timing/severity/associated sxs/prior Treatment) HPI Mr. Peek is a 34 year old male with past medical history of chronic back pain, drug abuse and dependence who presents the ER with complaint of ongoing pain in his upper right back associated with a lipoma diagnosed with CT in 01/15/14. Patient states he feels the growth has increased in size, and has been very irritating to sit against, or when moving his back. Patient denies redness, warmth, swelling, fever, chills, nausea, vomiting, shortness of breath, chest pain. Patient states he was unable to follow up with a surgeon, and is requesting a referral to surgery and [facility], and pain medication.  Past Medical History  Diagnosis Date  . Chronic back pain   . Drug abuse and dependence   . Anxiety   . Panic attack    Past Surgical History  Procedure Laterality Date  . Appendectomy     No family history on file. History  Substance Use Topics  . Smoking status: Current Some Day Smoker -- 0.50 packs/day    Types: Cigarettes  . Smokeless tobacco: Not on file  . Alcohol Use: No    Review of Systems  Constitutional: Negative for fever and chills.  Skin: Negative for color change, rash and wound.       Lipoma      Allergies  Ciprofloxacin hcl and Flagyl  Home Medications   Prior to Admission medications   Medication Sig Start Date End Date Taking? Authorizing Provider  HYDROcodone-acetaminophen (NORCO/VICODIN) 5-325 MG per tablet Take one-two tabs po q 4-6 hrs prn pain 01/15/14   Tammy L. Triplett, PA-C  ibuprofen (ADVIL,MOTRIN) 600 MG tablet Take 1 tablet (600 mg total) by mouth every 6 (six) hours as needed. 05/14/14   Carrie Mew, PA-C  naproxen (NAPROSYN) 500 MG tablet Take 1 tablet (500 mg  total) by mouth 2 (two) times daily. 01/15/14   Tammy L. Triplett, PA-C  traMADol (ULTRAM) 50 MG tablet Take 1 tablet (50 mg total) by mouth every 6 (six) hours as needed. 05/14/14   Carrie Mew, PA-C   BP 129/77 mmHg  Pulse 61  Temp(Src) 98 F (36.7 C) (Oral)  Resp 16  SpO2 97% Physical Exam  Constitutional: He is oriented to person, place, and time. He appears well-developed and well-nourished. No distress.  HENT:  Head: Normocephalic and atraumatic.  Eyes: Right eye exhibits no discharge. Left eye exhibits no discharge. No scleral icterus.  Neck: Normal range of motion.  Pulmonary/Chest: Effort normal. No tachypnea. No respiratory distress.    Benign appearing, non-erythematous, nonedematous, mobile lipoma noted to subcutaneous upper thoracic region just below the right scapula. No signs of infection or injury.  Musculoskeletal: Normal range of motion.  Neurological: He is alert and oriented to person, place, and time.  Skin: Skin is warm and dry. He is not diaphoretic.  Psychiatric: He has a normal mood and affect.  Nursing note and vitals reviewed.   ED Course  Procedures (including critical care time) Labs Review Labs Reviewed - No data to display  Imaging Review No results found.   EKG Interpretation None      MDM   Final diagnoses:  Lipoma     Patient here with ongoing discomfort from a lipoma diagnosed by CT  chest in 01/15/14 at [facility]. Lipoma is well-appearing, freely mobile, and does not appear to have any infectious process. Based on the fact that lipoma was diagnosed by CT, there is no concern for spread or malignancy at this point. I strongly encouraged patient to follow up with general surgery as recommended at his previous visit. Patient requested a referral to a surgeon and [facility], and was referred to [facility] surgery. I strongly encouraged patient to follow-up, discussed return precautions with patient and patient verbalized agreement and  understanding of this plan. I encouraged patient to call or return to the ER should he have any questions or concerns.  BP 129/77 mmHg  Pulse 61  Temp(Src) 98 F (36.7 C) (Oral)  Resp 16  SpO2 97%  Signed,  Dahlia Bailiff, PA-C 10:24 AM     Carrie Mew, PA-C 05/14/14 1024  Ernestina Patches, MD 05/14/14 1714

## 2014-05-14 NOTE — Discharge Instructions (Signed)
Lipoma  A lipoma is a noncancerous (benign) tumor composed of fat cells. They are usually found under the skin (subcutaneous). A lipoma may occur in any tissue of the body that contains fat. Common areas for lipomas to appear include the back, shoulders, buttocks, and thighs. Lipomas are a very common soft tissue growth. They are soft and grow slowly. Most problems caused by a lipoma depend on where it is growing.  DIAGNOSIS   A lipoma can be diagnosed with a physical exam. These tumors rarely become cancerous, but radiographic studies can help determine this for certain. Studies used may include:  · Computerized X-ray scans (CT or CAT scan).  · Computerized magnetic scans (MRI).  TREATMENT   Small lipomas that are not causing problems may be watched. If a lipoma continues to enlarge or causes problems, removal is often the best treatment. Lipomas can also be removed to improve appearance. Surgery is done to remove the fatty cells and the surrounding capsule. Most often, this is done with medicine that numbs the area (local anesthetic). The removed tissue is examined under a microscope to make sure it is not cancerous. Keep all follow-up appointments with your caregiver.  SEEK MEDICAL CARE IF:   · The lipoma becomes larger or hard.  · The lipoma becomes painful, red, or increasingly swollen. These could be signs of infection or a more serious condition.  Document Released: 04/13/2002 Document Revised: 07/16/2011 Document Reviewed: 09/23/2009  Exit[employer]® Patient Information ©2015 [employer]. This information is not intended to replace advice given to you by your health care provider. Make sure you discuss any questions you have with your health care provider.

## 2017-07-09 ENCOUNTER — Emergency Department (HOSPITAL_COMMUNITY): Admission: EM | Admit: 2017-07-09 | Discharge: 2017-07-09 | Discharge status: Absent without leave | Payer: Self-pay

## 2018-01-08 ENCOUNTER — Emergency Department (HOSPITAL_COMMUNITY)
Admission: EM | Admit: 2018-01-08 | Discharge: 2018-01-09 | Disposition: A | Discharge status: Home / Self Care | Payer: Self-pay | Attending: Emergency Medicine | Admitting: Emergency Medicine

## 2018-01-08 ENCOUNTER — Encounter (HOSPITAL_COMMUNITY): Payer: Self-pay

## 2018-01-08 ENCOUNTER — Emergency Department (HOSPITAL_COMMUNITY): Payer: Self-pay

## 2018-01-08 DIAGNOSIS — N50812 Left testicular pain: Secondary | ICD-10-CM | POA: Insufficient documentation

## 2018-01-08 DIAGNOSIS — F419 Anxiety disorder, unspecified: Secondary | ICD-10-CM | POA: Insufficient documentation

## 2018-01-08 DIAGNOSIS — F17210 Nicotine dependence, cigarettes, uncomplicated: Secondary | ICD-10-CM | POA: Insufficient documentation

## 2018-01-08 DIAGNOSIS — Z79899 Other long term (current) drug therapy: Secondary | ICD-10-CM | POA: Insufficient documentation

## 2018-01-08 NOTE — ED Triage Notes (Signed)
Pt reports that he has had left side testicle pain that started 1+ month ago . Pt reports area is swollen 10/10 sharp pain. Pt reports that he has a grape size lump under the testicle.  Pt reports that the pain has worsening. Pt reports that he has associated nausea.

## 2018-01-09 LAB — COMPREHENSIVE METABOLIC PANEL
ALBUMIN: 4.2 g/dL (ref 3.5–5.0)
ALT: 23 U/L (ref 0–44)
AST: 26 U/L (ref 15–41)
Alkaline Phosphatase: 53 U/L (ref 38–126)
Anion gap: 7 (ref 5–15)
BUN: 16 mg/dL (ref 6–20)
CALCIUM: 9.3 mg/dL (ref 8.9–10.3)
CHLORIDE: 105 mmol/L (ref 98–111)
CO2: 29 mmol/L (ref 22–32)
Creatinine, Ser: 1.06 mg/dL (ref 0.61–1.24)
GFR calc Af Amer: >60 mL/min (ref >60)
GLUCOSE: 78 mg/dL (ref 70–99)
Potassium: 4.5 mmol/L (ref 3.5–5.1)
SODIUM: 141 mmol/L (ref 135–145)
Total Bilirubin: 0.4 mg/dL (ref 0.3–1.2)
Total Protein: 7.6 g/dL (ref 6.5–8.1)

## 2018-01-09 LAB — URINALYSIS, ROUTINE W REFLEX MICROSCOPIC
Bilirubin Urine: NEGATIVE
GLUCOSE, UA: NEGATIVE mg/dL
Hgb urine dipstick: NEGATIVE
Ketones, ur: NEGATIVE mg/dL
Leukocytes, UA: NEGATIVE
NITRITE: NEGATIVE
PROTEIN: NEGATIVE mg/dL
Specific Gravity, Urine: 1.017 (ref 1.005–1.030)
pH: 8 (ref 5.0–8.0)

## 2018-01-09 LAB — CBC WITH DIFFERENTIAL/PLATELET
BASOS PCT: 2 %
Basophils Absolute: 0.1 10*3/uL (ref 0.0–0.1)
Eosinophils Absolute: 0.2 10*3/uL (ref 0.0–0.7)
Eosinophils Relative: 2 %
HCT: 41.3 % (ref 39.0–52.0)
HEMOGLOBIN: 13.5 g/dL (ref 13.0–17.0)
LYMPHS ABS: 2.3 10*3/uL (ref 0.7–4.0)
Lymphocytes Relative: 33 %
MCH: 31.3 pg (ref 26.0–34.0)
MCHC: 32.7 g/dL (ref 30.0–36.0)
MCV: 95.6 fL (ref 78.0–100.0)
MONO ABS: 0.7 10*3/uL (ref 0.1–1.0)
MONOS PCT: 10 %
NEUTROS ABS: 3.8 10*3/uL (ref 1.7–7.7)
Neutrophils Relative %: 53 %
Platelets: 296 10*3/uL (ref 150–400)
RBC: 4.32 MIL/uL (ref 4.22–5.81)
RDW: 13.7 % (ref 11.5–15.5)
WBC: 7 10*3/uL (ref 4.0–10.5)

## 2018-01-09 MED ORDER — IBUPROFEN 800 MG PO TABS
800.0000 mg | ORAL_TABLET | Freq: Once | ORAL | Status: AC
  Administered 2018-01-09: 800 mg via ORAL
  Filled 2018-01-09: qty 1

## 2018-01-09 MED ORDER — ONDANSETRON 8 MG PO TBDP
8.0000 mg | ORAL_TABLET | Freq: Once | ORAL | Status: AC
  Administered 2018-01-09: 8 mg via ORAL
  Filled 2018-01-09: qty 1

## 2018-01-09 NOTE — Discharge Instructions (Addendum)
The cause of your pain is not clear.  Ultrasound did not show any mass.  Apply ice.  Take anti-inflammatory medication like ibuprofen or naproxen.  You may take acetaminophen as needed to get additional pain relief.  Follow-up with the urologist.  Return if symptoms are getting worse.

## 2018-01-09 NOTE — ED Provider Notes (Signed)
[place] DEPT Provider Note   CSN: [id number] Arrival date & time: 01/08/18  2140     History   Chief Complaint Chief Complaint  Patient presents with  . Testicle Pain    HPI Steven Arias is a 37 y.o. male.  The history is provided by the patient.  He has history of chronic back pain, drug abuse and comes in complaining of left testicular pain for approximately 4-6 weeks.  He has noted a nodule on his testicle which he states is growing.  He states it constantly feels as if he has been kicked in the groin and has constant nausea.  He has not vomited.  He is also concerned about a possible hernia because he does a lot of heavy lifting.  Specifically, about 2 months ago, he had helped someone move and lifted a lot of heavy things and injured his right shoulder at that time.  He is concerned about having exacerbated a hernia at that time.  He has not noted any scrotal swelling.  He has not done anything to treat his symptoms.  Past Medical History:  Diagnosis Date  . Anxiety   . Chronic back pain   . Drug abuse and dependence ([place])   . Panic attack     There are no active problems to display for this patient.   Past Surgical History:  Procedure Laterality Date  . APPENDECTOMY          Home Medications    Prior to Admission medications   Medication Sig Start Date End Date Taking? Authorizing Provider  HYDROcodone-acetaminophen (NORCO/VICODIN) 5-325 MG per tablet Take one-two tabs po q 4-6 hrs prn pain 01/15/14   Triplett, Tammy, PA-C  ibuprofen (ADVIL,MOTRIN) 600 MG tablet Take 1 tablet (600 mg total) by mouth every 6 (six) hours as needed. 05/14/14   Dahlia Bailiff, PA-C  naproxen (NAPROSYN) 500 MG tablet Take 1 tablet (500 mg total) by mouth 2 (two) times daily. 01/15/14   Triplett, Tammy, PA-C  traMADol (ULTRAM) 50 MG tablet Take 1 tablet (50 mg total) by mouth every 6 (six) hours as needed. 05/14/14   Dahlia Bailiff, PA-C    Family History History  reviewed. No pertinent family history.  Social History Social History   Tobacco Use  . Smoking status: Current Some Day Smoker    Packs/day: 0.50    Types: Cigarettes  . Smokeless tobacco: Current User  Substance Use Topics  . Alcohol use: No  . Drug use: No    Types: Opium, Benzodiazepines     Allergies   Ciprofloxacin hcl and Flagyl [metronidazole hcl]   Review of Systems Review of Systems  All other systems reviewed and are negative.    Physical Exam Updated Vital Signs BP 113/64   Pulse 71   Temp 98.3 F (36.8 C) (Oral)   Resp 18   Ht 5\' 10"  (1.778 m)   Wt 69 kg   SpO2 100%   BMI 21.82 kg/m   Physical Exam  Nursing note and vitals reviewed.  37 year old male, resting comfortably and in no acute distress. Vital signs are normal. Oxygen saturation is 100%, which is normal. Head is normocephalic and atraumatic. PERRLA, EOMI. Oropharynx is clear. Neck is nontender and supple without adenopathy or JVD. Back is nontender and there is no CVA tenderness. Lungs are clear without rales, wheezes, or rhonchi. Chest is nontender. Heart has regular rate and rhythm without murmur. Abdomen is soft, flat, nontender without masses or  hepatosplenomegaly and peristalsis is normoactive. Genitalia: Circumcised penis.  Testes descended.  No testicular masses appreciated.  Moderate tenderness at the inferior pole of the left testicle.  Marked tenderness in the left inguinal canal without any mass or hernia felt. Extremities have no cyanosis or edema, full range of motion is present. Skin is warm and dry without rash. Neurologic: Mental status is normal, cranial nerves are intact, there are no motor or sensory deficits.  ED Treatments / Results  Labs (all labs ordered are listed, but only abnormal results are displayed) Labs Reviewed  URINALYSIS, ROUTINE W REFLEX MICROSCOPIC - Abnormal; Notable for the following components:      Result Value   APPearance HAZY (*)     Bacteria, UA RARE (*)    All other components within normal limits  COMPREHENSIVE METABOLIC PANEL  CBC WITH DIFFERENTIAL/PLATELET    Radiology US Scrotum  Result Date: 01/09/2018 CLINICAL DATA:  Initial evaluation for acute testicular pain for 1 month, question lump on left testicle. EXAM: SCROTAL ULTRASOUND DOPPLER ULTRASOUND OF THE TESTICLES TECHNIQUE: Complete ultrasound examination of the testicles, epididymis, and other scrotal structures was performed. Color and spectral Doppler ultrasound were also utilized to evaluate blood flow to the testicles. COMPARISON:  None. FINDINGS: Right testicle Measurements: 4.2 x 2.4 x 3.4 cm. No mass or microlithiasis visualized. Left testicle Measurements: 4.0 x 2.8 x 2.9 cm. No mass or microlithiasis visualized. Right epididymis:  Normal in size and appearance. Left epididymis:  Normal in size and appearance. Hydrocele:  None visualized. Varicocele:  None visualized. Pulsed Doppler interrogation of both testes demonstrates normal low resistance arterial and venous waveforms bilaterally. IMPRESSION: Normal scrotal ultrasound. No discrete mass or other acute abnormality identified. Electronically Signed   By: Jeannine Boga M.D.   On: 01/09/2018 00:49    Procedures Procedures   Medications Ordered in ED Medications - No data to display   Initial Impression / Assessment and Plan / ED Course  I have reviewed the triage vital signs and the nursing notes.  Pertinent labs & imaging results that were available during my care of the patient were reviewed by me and considered in my medical decision making (see chart for details).  Left testicular pain of uncertain cause.  Scrotal ultrasound had been ordered, results pending.  Will get screening labs.  Anticipate need for urology referral.  Of note, patient states that he has no health insurance and is on a very limited income.  Advised he will be screened for serious pathology in the ED, but would still  require evaluation by urology.  Old records are reviewed showing no relevant past visits, but he does have prior ED visits related to narcotic abuse and overdose.  Ultrasound was normal.  Urinalysis is unremarkable and renal function is normal.  Patient advised to apply ice and take over-the-counter anti-inflammatory medications to help control pain.  Referred to urology for further evaluation.  Return precautions discussed.  Final Clinical Impressions(s) / ED Diagnoses   Final diagnoses:  Left testicular pain    ED Discharge Orders    None       Delora Fuel, MD 49/17/91 0236

## 2021-08-26 ENCOUNTER — Encounter (HOSPITAL_BASED_OUTPATIENT_CLINIC_OR_DEPARTMENT_OTHER): Payer: Self-pay

## 2021-08-26 ENCOUNTER — Emergency Department (HOSPITAL_BASED_OUTPATIENT_CLINIC_OR_DEPARTMENT_OTHER): Payer: Self-pay

## 2021-08-26 ENCOUNTER — Other Ambulatory Visit: Payer: Self-pay

## 2021-08-26 NOTE — ED Triage Notes (Signed)
He states that, yesterday as he was loading something onto a truck, his right hand "got stuck in the gate". He c/o pain and edema medial right hand. Ice pack applied and x-ray ordered at triage. He is ambulatory and in no distress. ?

## 2021-11-02 ENCOUNTER — Encounter (HOSPITAL_BASED_OUTPATIENT_CLINIC_OR_DEPARTMENT_OTHER): Payer: Self-pay | Admitting: Emergency Medicine

## 2021-11-02 ENCOUNTER — Emergency Department (HOSPITAL_BASED_OUTPATIENT_CLINIC_OR_DEPARTMENT_OTHER)
Admission: EM | Admit: 2021-11-02 | Discharge: 2021-11-02 | Discharge status: Against Medical Advice | Payer: Commercial Managed Care - HMO | Attending: Emergency Medicine | Admitting: Emergency Medicine

## 2021-11-02 ENCOUNTER — Other Ambulatory Visit: Payer: Self-pay

## 2021-11-02 DIAGNOSIS — X501XXA Overexertion from prolonged static or awkward postures, initial encounter: Secondary | ICD-10-CM | POA: Diagnosis not present

## 2021-11-02 DIAGNOSIS — Z5321 Procedure and treatment not carried out due to patient leaving prior to being seen by health care provider: Secondary | ICD-10-CM | POA: Insufficient documentation

## 2021-11-02 DIAGNOSIS — R1032 Left lower quadrant pain: Secondary | ICD-10-CM | POA: Diagnosis present

## 2021-11-02 NOTE — ED Triage Notes (Signed)
Clarify: pt felt a pop while lifting his mother

## 2021-11-02 NOTE — ED Notes (Signed)
Patient called once more for Examination Room. No Answer. Patient not visualized in Waiting Area. To be discharged accordingly.

## 2021-11-02 NOTE — ED Triage Notes (Signed)
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 He felt a pop few days ago, left groin is painful.

## 2021-11-02 NOTE — ED Notes (Signed)
Called for pt, no answer

## 2021-11-18 ENCOUNTER — Encounter (HOSPITAL_BASED_OUTPATIENT_CLINIC_OR_DEPARTMENT_OTHER): Payer: Self-pay | Admitting: Emergency Medicine

## 2021-11-18 ENCOUNTER — Other Ambulatory Visit: Payer: Self-pay

## 2021-11-18 ENCOUNTER — Emergency Department (HOSPITAL_BASED_OUTPATIENT_CLINIC_OR_DEPARTMENT_OTHER)
Admission: EM | Admit: 2021-11-18 | Discharge: 2021-11-18 | Disposition: A | Discharge status: Home / Self Care | Payer: Commercial Managed Care - HMO | Attending: Emergency Medicine | Admitting: Emergency Medicine

## 2021-11-18 DIAGNOSIS — S76212A Strain of adductor muscle, fascia and tendon of left thigh, initial encounter: Secondary | ICD-10-CM | POA: Insufficient documentation

## 2021-11-18 DIAGNOSIS — X58XXXA Exposure to other specified factors, initial encounter: Secondary | ICD-10-CM | POA: Diagnosis not present

## 2021-11-18 DIAGNOSIS — S79922A Unspecified injury of left thigh, initial encounter: Secondary | ICD-10-CM | POA: Diagnosis present

## 2021-11-18 MED ORDER — IBUPROFEN 800 MG PO TABS: 800.0000 mg | ORAL_TABLET | Freq: Three times a day (TID) | ORAL | 0 refills | Status: AC

## 2021-11-18 NOTE — Discharge Instructions (Signed)
Recommend 800 mg ibuprofen every 8 hours as needed for pain.  Recommend 1000 mg of Tylenol every 6 hours as needed for pain.  Avoid any heavy lifting as discussed.  Follow-up with sports medicine.

## 2021-11-18 NOTE — ED Notes (Signed)
Discharge paperwork given and understood. 

## 2021-11-18 NOTE — ED Provider Notes (Signed)
[place] EMERGENCY DEPT Provider Note   CSN: [id number] Arrival date & time: 11/18/21  1210     History  Chief Complaint  Patient presents with   Groin Injury    Steven Arias is a 41 y.o. male.  Here with right groin pain.  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  Denies any abdominal pain, nausea, vomiting.  No trouble going to the bathroom.  No testicular pain or penile discharge.  No trouble going to the bathroom or having bowel movements.  He has not noticed any bulges or hernias.  Nothing makes it better.  Movement makes it worse.  The history is provided by the patient.       Home Medications Prior to Admission medications   Medication Sig Start Date End Date Taking? Authorizing Provider  ibuprofen (ADVIL) 800 MG tablet Take 1 tablet (800 mg total) by mouth 3 (three) times daily. 11/18/21  Yes Sparkle Aube, DO      Allergies    Ciprofloxacin hcl and Flagyl [metronidazole hcl]    Review of Systems   Review of Systems  Physical Exam Updated Vital Signs BP 133/81 (BP Location: Right Arm)   Pulse 71   Temp 98 F (36.7 C)   Resp 20   SpO2 98%  Physical Exam Vitals and nursing note reviewed.  Constitutional:      General: He is not in acute distress.    Appearance: He is well-developed. He is not ill-appearing.  HENT:     Head: Normocephalic and atraumatic.     Nose: Nose normal.     Mouth/Throat:     Mouth: Mucous membranes are moist.  Eyes:     Extraocular Movements: Extraocular movements intact.     Conjunctiva/sclera: Conjunctivae normal.     Pupils: Pupils are equal, round, and reactive to light.  Cardiovascular:     Rate and Rhythm: Normal rate and regular rhythm.     Pulses: Normal pulses.     Heart sounds: No murmur heard. Pulmonary:     Effort: Pulmonary effort is normal. No respiratory distress.     Breath sounds: Normal breath sounds.  Abdominal:     Palpations: Abdomen is  soft.     Tenderness: There is no abdominal tenderness.  Genitourinary:    Penis: Normal.      Testes: Normal.  Musculoskeletal:        General: Tenderness present. No swelling.     Cervical back: Neck supple.     Comments: Tenderness in the left hip flexor area, no hernia, no erythema redness or swelling  Skin:    General: Skin is warm and dry.     Capillary Refill: Capillary refill takes less than 2 seconds.  Neurological:     General: No focal deficit present.     Mental Status: He is alert.  Psychiatric:        Mood and Affect: Mood normal.     ED Results / Procedures / Treatments   Labs (all labs ordered are listed, but only abnormal results are displayed) Labs Reviewed - No data to display  EKG None  Radiology No results found.  Procedures Procedures    Medications Ordered in ED Medications - No data to display  ED Course/ Medical Decision Making/ A&P  Medical Decision Making Risk Prescription drug management.   Steven Arias is here with left groin pain.  Normal vitals.  No fever.  He is not tender in his abdomen.  He is not tender in his testicles or penis.  Tenderness is within the left hip flexor area.  There is no hernia.  My suspicion is that this is a hip flexor strain/groin strain.  I have no concern for testicular torsion, diverticulitis, bowel obstruction or hernia at this time.  Recommend Tylenol and ibuprofen.  Discharged in good condition.  Understands return precautions.  This chart was dictated using voice recognition software.  Despite best efforts to proofread,  errors can occur which can change the documentation meaning.         Final Clinical Impression(s) / ED Diagnoses Final diagnoses:  Inguinal strain, left, initial encounter    Rx / DC Orders ED Discharge Orders          Ordered    ibuprofen (ADVIL) 800 MG tablet  3 times daily        11/18/21 1519              Lennice Sites, DO 11/18/21  1521

## 2021-11-18 NOTE — ED Triage Notes (Signed)
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

## 2022-08-08 ENCOUNTER — Emergency Department (HOSPITAL_BASED_OUTPATIENT_CLINIC_OR_DEPARTMENT_OTHER): Payer: BLUE CROSS/BLUE SHIELD

## 2022-08-08 ENCOUNTER — Other Ambulatory Visit: Payer: Self-pay

## 2022-08-08 ENCOUNTER — Emergency Department (HOSPITAL_BASED_OUTPATIENT_CLINIC_OR_DEPARTMENT_OTHER)
Admission: EM | Admit: 2022-08-08 | Discharge: 2022-08-08 | Disposition: A | Discharge status: Home / Self Care | Payer: BLUE CROSS/BLUE SHIELD | Attending: Emergency Medicine | Admitting: Emergency Medicine

## 2022-08-08 DIAGNOSIS — W57XXXA Bitten or stung by nonvenomous insect and other nonvenomous arthropods, initial encounter: Secondary | ICD-10-CM | POA: Diagnosis not present

## 2022-08-08 DIAGNOSIS — S81852A Open bite, left lower leg, initial encounter: Secondary | ICD-10-CM | POA: Insufficient documentation

## 2022-08-08 DIAGNOSIS — M79605 Pain in left leg: Secondary | ICD-10-CM

## 2022-08-08 DIAGNOSIS — T63301A Toxic effect of unspecified spider venom, accidental (unintentional), initial encounter: Secondary | ICD-10-CM

## 2022-08-08 DIAGNOSIS — M25562 Pain in left knee: Secondary | ICD-10-CM | POA: Insufficient documentation

## 2022-08-08 LAB — CBC WITH DIFFERENTIAL/PLATELET
Abs Immature Granulocytes: 0.02 10*3/uL (ref 0.00–0.07)
Basophils Absolute: 0.1 10*3/uL (ref 0.0–0.1)
Basophils Relative: 1 %
Eosinophils Absolute: 0.1 10*3/uL (ref 0.0–0.5)
Eosinophils Relative: 1 %
HCT: 43 % (ref 39.0–52.0)
Hemoglobin: 14.2 g/dL (ref 13.0–17.0)
Immature Granulocytes: 0 %
Lymphocytes Relative: 28 %
Lymphs Abs: 2 10*3/uL (ref 0.7–4.0)
MCH: 32.9 pg (ref 26.0–34.0)
MCHC: 33 g/dL (ref 30.0–36.0)
MCV: 99.5 fL (ref 80.0–100.0)
Monocytes Absolute: 0.7 10*3/uL (ref 0.1–1.0)
Monocytes Relative: 9 %
Neutro Abs: 4.4 10*3/uL (ref 1.7–7.7)
Neutrophils Relative %: 61 %
Platelets: 357 10*3/uL (ref 150–400)
RBC: 4.32 MIL/uL (ref 4.22–5.81)
RDW: 13.9 % (ref 11.5–15.5)
WBC: 7.2 10*3/uL (ref 4.0–10.5)
nRBC: 0 % (ref 0.0–0.2)

## 2022-08-08 LAB — BASIC METABOLIC PANEL
Anion gap: 10 (ref 5–15)
BUN: 5 mg/dL — ABNORMAL LOW (ref 6–20)
CO2: 25 mmol/L (ref 22–32)
Calcium: 9.1 mg/dL (ref 8.9–10.3)
Chloride: 102 mmol/L (ref 98–111)
Creatinine, Ser: 0.9 mg/dL (ref 0.61–1.24)
GFR, Estimated: >60 mL/min (ref >60)
Glucose, Bld: 85 mg/dL (ref 70–99)
Potassium: 3.6 mmol/L (ref 3.5–5.1)
Sodium: 137 mmol/L (ref 135–145)

## 2022-08-08 MED ORDER — TETANUS-DIPHTH-ACELL PERTUSSIS 5-2.5-18.5 LF-MCG/0.5 IM SUSY
0.5000 mL | PREFILLED_SYRINGE | Freq: Once | INTRAMUSCULAR | Status: DC
  Filled 2022-08-08: qty 0.5

## 2022-08-08 MED ORDER — OXYCODONE-ACETAMINOPHEN 5-325 MG PO TABS
2.0000 | ORAL_TABLET | Freq: Once | ORAL | Status: AC
  Administered 2022-08-08: 2 via ORAL
  Filled 2022-08-08: qty 2

## 2022-08-08 NOTE — ED Triage Notes (Signed)
A+Ox4. Ambulatory to triage.  Bit by spider on left foot 3/10. Bite appears to be well healed. Comes to ED today for complaint of knee swelling of same leg. Full ROM. Dorsal Ped Pulse strong bilateral, sensation intact. No warmth, discoloration to leg.  Afebrile. Denies CP, denies SOB, -N/-V.

## 2022-08-08 NOTE — Discharge Instructions (Signed)
Thank you for letting us take care of you today.  Overall, your workup was reassuring including blood work and the x-rays of your left foot and left knee.  You do not appear septic or to have a bloodstream infection.  There are no signs of infection to the bones in your legs.  Your kidney function was normal.  The area of the spider bite does not appear acutely infected so I do not see an indication for antibiotics.  I believe you are safe to be discharged home at this time but please follow-up with primary care as needed.  If you do not have a primary care I have provided 2 clinics that you may contact to follow-up with as needed.  If you develop any new or worsening symptoms such as chest pain, shortness of breath, vomiting, fever, inability to bear weight, skin changes around the wound or to the leg, or other new, concerning symptoms, please return to the nearest emergency department for reevaluation.

## 2022-08-08 NOTE — ED Provider Notes (Signed)
[facility] Provider Note   CSN: [id number] Arrival date & time: 08/08/22  1835     History  Chief Complaint  Patient presents with   Leg Swelling    Steven Arias is a 42 y.o. male with past medical history of chronic pain, anxiety, polysubstance abuse who presents to the ED complaining of left leg pain.  Patient reports that he was bitten by a spider when helping someone move about a month ago and since that time he has had pain in his left leg.  He reports that pain is now in his left knee and is causing him difficulty with ambulation and that his job where he is very active.  He denies associated fever, chills, chest pain, nausea, vomiting, shortness of breath, redness or drainage from the wounds.  He states that "I really need an excuse for work as I am about to lose my job."      Home Medications Prior to Admission medications   Medication Sig Start Date End Date Taking? Authorizing Provider  ibuprofen (ADVIL) 800 MG tablet Take 1 tablet (800 mg total) by mouth 3 (three) times daily. 11/18/21   Curatolo, Adam, DO      Allergies    Ciprofloxacin hcl and Flagyl [metronidazole hcl]    Review of Systems   Review of Systems  All other systems reviewed and are negative.   Physical Exam Updated Vital Signs BP (!) 159/105   Pulse 65   Temp 98.2 F (36.8 C) (Oral)   Resp 18   Ht 5\' 10"  (1.778 m)   Wt 93 kg   SpO2 100%   BMI 29.41 kg/m  Physical Exam Vitals and nursing note reviewed.  Constitutional:      General: He is not in acute distress.    Appearance: Normal appearance. He is not ill-appearing, toxic-appearing or diaphoretic.  HENT:     Head: Normocephalic and atraumatic.     Mouth/Throat:     Mouth: Mucous membranes are moist.  Eyes:     Extraocular Movements: Extraocular movements intact.     Conjunctiva/sclera: Conjunctivae normal.     Pupils: Pupils are equal, round, and reactive to light.  Cardiovascular:      Rate and Rhythm: Normal rate and regular rhythm.     Heart sounds: No murmur heard. Pulmonary:     Effort: Pulmonary effort is normal. No respiratory distress.     Breath sounds: Normal breath sounds. No stridor. No wheezing, rhonchi or rales.  Abdominal:     General: Abdomen is flat. There is no distension.     Palpations: Abdomen is soft.     Tenderness: There is no abdominal tenderness. There is no guarding or rebound.  Musculoskeletal:        General: Normal range of motion.     Cervical back: Neck supple.     Right lower leg: No edema.     Left lower leg: No edema.     Comments: Small less than 1 cm circular area to the dorsum of the left midfoot where patient states that he was bitten by a spider about a month ago, no underlying induration or fluctuance, no surrounding erythema, increased warmth, or other skin changes or drainage, overall area appears well-healed though there is an existing scar, 2+ DP and PT pulses, entirety of left lower extremity was palpated and nontender without noticeable joint effusion, no overlying skin changes, or deformity.  Patient stands and ambulates on the leg  without signs of acute distress.  Range of motion intact.  Compartments soft.  Skin:    General: Skin is warm and dry.     Capillary Refill: Capillary refill takes less than 2 seconds.  Neurological:     Mental Status: He is alert. Mental status is at baseline.  Psychiatric:        Behavior: Behavior normal.     ED Results / Procedures / Treatments   Labs (all labs ordered are listed, but only abnormal results are displayed) Labs Reviewed  BASIC METABOLIC PANEL - Abnormal; Notable for the following components:      Result Value   BUN 5 (*)    All other components within normal limits  CBC WITH DIFFERENTIAL/PLATELET    EKG None  Radiology DG Foot Complete Left  Result Date: 08/08/2022 CLINICAL DATA:  Spider bite with increased pain on the left foot. EXAM: LEFT FOOT - COMPLETE 3+ VIEW  COMPARISON:  None Available. FINDINGS: There is no evidence of fracture or dislocation. There is no evidence of arthropathy or other focal bone abnormality. Soft tissues are unremarkable. IMPRESSION: No acute osseous abnormality. Electronically Signed   By: Placido Sou M.D.   On: 08/08/2022 20:27   DG Knee Complete 4 Views Left  Result Date: 08/08/2022 CLINICAL DATA:  Spider bite, pain EXAM: LEFT KNEE - COMPLETE 4+ VIEW COMPARISON:  None Available. FINDINGS: No fracture or dislocation is seen. There is no significant effusion. There are no focal lytic lesions. Minimal bony spurs are seen in medial, lateral and patellofemoral compartments. IMPRESSION: No recent fracture or dislocation is seen in left knee. Degenerative changes with minimal bony spurs are noted. Electronically Signed   By: Elmer Picker M.D.   On: 08/08/2022 19:49    Procedures Procedures    Medications Ordered in ED Medications  Tdap (BOOSTRIX) injection 0.5 mL (has no administration in time range)  oxyCODONE-acetaminophen (PERCOCET/ROXICET) 5-325 MG per tablet 2 tablet (2 tablets Oral Given 08/08/22 2022)    ED Course/ Medical Decision Making/ A&P                             Medical Decision Making Amount and/or Complexity of Data Reviewed Labs: ordered. Decision-making details documented in ED Course. Radiology: ordered. Decision-making details documented in ED Course.   Medical Decision Making:   Steven Arias is a 42 y.o. male who presented to the ED today with left leg pain detailed above.    Patient's presentation is complicated by their history of potential spider bite.  Complete initial physical exam performed, notably the patient  was in no acute distress.  Healed scar to the dorsum of the left foot where he reports he was previously bitten by a spider.  No underlying fluctuance, induration, no overlying skin changes.  All joints of the left lower extremity palpated, soft and without joint effusion or  deformity.   Neurovascularly intact.  Range of motion intact.  Patient nontoxic-appearing. Reviewed and confirmed nursing documentation for past medical history, family history, social history.    Initial Assessment:   With the patient's presentation of left leg pain, differential diagnosis includes but is not limited to spider bite, cellulitis, abscess, osteomyelitis, compartment syndrome, septic joint, systemic illness. This is most consistent with an acute complicated illness  Initial Plan:  Screening labs including CBC and Metabolic panel to evaluate for infectious or metabolic etiology of disease.  X-ray of left foot and left knee as  patient reports most of his pain is to these areas Pain management  objective evaluation as reviewed   Initial Study Results:   Laboratory  All laboratory results reviewed without evidence of clinically relevant pathology.    Radiology:  All images reviewed independently. Agree with radiology report at this time.   DG Foot Complete Left  Result Date: 08/08/2022 CLINICAL DATA:  Spider bite with increased pain on the left foot. EXAM: LEFT FOOT - COMPLETE 3+ VIEW COMPARISON:  None Available. FINDINGS: There is no evidence of fracture or dislocation. There is no evidence of arthropathy or other focal bone abnormality. Soft tissues are unremarkable. IMPRESSION: No acute osseous abnormality. Electronically Signed   By: Placido Sou M.D.   On: 08/08/2022 20:27   DG Knee Complete 4 Views Left  Result Date: 08/08/2022 CLINICAL DATA:  Spider bite, pain EXAM: LEFT KNEE - COMPLETE 4+ VIEW COMPARISON:  None Available. FINDINGS: No fracture or dislocation is seen. There is no significant effusion. There are no focal lytic lesions. Minimal bony spurs are seen in medial, lateral and patellofemoral compartments. IMPRESSION: No recent fracture or dislocation is seen in left knee. Degenerative changes with minimal bony spurs are noted. Electronically Signed   By: Elmer Picker M.D.   On: 08/08/2022 19:49    Final Assessment and Plan:   This is a 42 year old male who presents to the ED complaining of left flank pain.  He notes concern for complication related to a spider bite that occurred to the left foot a month ago.  Area to the dorsum of the left foot where he reports spider bite appears to be well-healed.  No signs of abscess or cellulitis.  Patient reports pain extends up the left leg most notably to the foot and to the knee.  X-ray obtained in triage of the left knee was negative.  Patient concerned for systemic infection and thus workup obtained as above for further evaluation in addition to the pain management.  Patient is nontoxic-appearing.  No signs of septic joint.  Overall, workup is reassuring.  Patient is afebrile, not hypotensive, normal heart rate, no leukocytosis.  He does not meet SIRS criteria.  He is well-appearing.  Wound does not appear acutely infected.  No signs of osteomyelitis on x-ray.  No indication for antibiotics at this time.  Tdap updated.  Patient stable to be discharged home.  Strict ED return precautions given, all questions answered, and stable for discharge.   Clinical Impression:  1. Spider bite wound, accidental or unintentional, initial encounter   2. Left leg pain      Discharge           Final Clinical Impression(s) / ED Diagnoses Final diagnoses:  Spider bite wound, accidental or unintentional, initial encounter  Left leg pain    Rx / DC Orders ED Discharge Orders     None         Turner Daniels 08/08/22 2220    Davonna Belling, MD 08/08/22 2334

## 2022-08-08 NOTE — ED Notes (Signed)
Pt refuses tetanus shot- explained risks and possible death from tetanus- encouraged to follow up with PCP for tetanus vaccine. Pt verbalized understanding of d/c instructions, meds, and followup care. Denies questions. VSS, no distress noted. Steady gait to exit with all belongings.

## 2023-03-15 ENCOUNTER — Other Ambulatory Visit: Payer: Self-pay

## 2023-03-15 ENCOUNTER — Emergency Department (HOSPITAL_COMMUNITY)
Admission: EM | Admit: 2023-03-15 | Discharge: 2023-03-15 | Disposition: A | Discharge status: Home / Self Care | Payer: BLUE CROSS/BLUE SHIELD | Attending: Emergency Medicine | Admitting: Emergency Medicine

## 2023-03-15 DIAGNOSIS — Y9389 Activity, other specified: Secondary | ICD-10-CM | POA: Diagnosis not present

## 2023-03-15 DIAGNOSIS — S61412A Laceration without foreign body of left hand, initial encounter: Secondary | ICD-10-CM | POA: Insufficient documentation

## 2023-03-15 DIAGNOSIS — W268XXA Contact with other sharp object(s), not elsewhere classified, initial encounter: Secondary | ICD-10-CM | POA: Diagnosis not present

## 2023-03-15 NOTE — Discharge Instructions (Signed)
Today you were seen for a laceration to your left hand.  Thank you for letting us treat you today. After performing a physical exam, I feel you are safe to go home. Please follow up with your PCP in the next several days and provide them with your records from this visit. Return to the Emergency Room if pain becomes severe or symptoms worsen.

## 2023-03-15 NOTE — ED Provider Notes (Signed)
[place] EMERGENCY DEPARTMENT AT [facility] Provider Note   CSN: [id number] Arrival date & time: 03/15/23  1443     History  Chief Complaint  Patient presents with   Laceration    Steven Arias is a 42 y.o. male past medical history significant for drug abuse presents today for a laceration to his left hand.  Patient states he was dismantling an [facility] unit yesterday when a piece of metal cut his left palm.  Patient states he has mild pain.  Patient denies numbness, tingling, difficulty moving hand.  Patient is unsure when his last Tdap was but refuses to update because he is "scared of needles".   Laceration      Home Medications Prior to Admission medications   Medication Sig Start Date End Date Taking? Authorizing Provider  ibuprofen (ADVIL) 800 MG tablet Take 1 tablet (800 mg total) by mouth 3 (three) times daily. 11/18/21   Curatolo, Adam, DO      Allergies    Ciprofloxacin hcl and Flagyl [metronidazole hcl]    Review of Systems   Review of Systems  Skin:  Positive for wound.    Physical Exam Updated Vital Signs BP (!) 161/109 (BP Location: Right Arm)   Pulse 100   Temp 98.2 F (36.8 C) (Oral)   Resp 18   Ht 5\' 10"  (1.778 m)   Wt 95.7 kg   SpO2 95%   BMI 30.28 kg/m  Physical Exam Vitals and nursing note reviewed.  Constitutional:      General: He is not in acute distress.    Appearance: He is well-developed.  HENT:     Head: Normocephalic and atraumatic.  Eyes:     Conjunctiva/sclera: Conjunctivae normal.  Cardiovascular:     Rate and Rhythm: Normal rate and regular rhythm.     Heart sounds: No murmur heard. Pulmonary:     Effort: Pulmonary effort is normal. No respiratory distress.     Breath sounds: Normal breath sounds.  Abdominal:     Palpations: Abdomen is soft.     Tenderness: There is no abdominal tenderness.  Musculoskeletal:        General: No swelling.     Cervical back: Neck supple.  Skin:    General: Skin is warm and dry.      Capillary Refill: Capillary refill takes less than 2 seconds.     Comments: Laceration to patient's left palm near wrist approximately 2 to 3 cm in length.  Wound is hemostatic.  Patient is able to move hand through full range of motion.  Patient is neurovascularly intact  Neurological:     Mental Status: He is alert.  Psychiatric:        Mood and Affect: Mood normal.     ED Results / Procedures / Treatments   Labs (all labs ordered are listed, but only abnormal results are displayed) Labs Reviewed - No data to display  EKG None  Radiology No results found.  Procedures .Marland KitchenLaceration Repair  Date/Time: 03/15/2023 4:45 PM  Performed by: Dolphus Jenny, PA-C Authorized by: Dolphus Jenny, PA-C   Consent:    Consent obtained:  Verbal   Consent given by:  Patient   Risks discussed:  Infection, pain and retained foreign body   Alternatives discussed:  No treatment Laceration details:    Location:  Hand   Hand location:  L palm   Length (cm):  2.5   Depth (mm):  2 Exploration:    Hemostasis achieved  with:  Direct pressure Treatment:    Area cleansed with:  Saline   Amount of cleaning:  Extensive   Irrigation solution:  Sterile saline   Irrigation method:  Pressure wash Skin repair:    Repair method:  Tissue adhesive Approximation:    Approximation:  Loose Repair type:    Repair type:  Simple Post-procedure details:    Dressing:  Open (no dressing)   Procedure completion:  Tolerated     Medications Ordered in ED Medications - No data to display  ED Course/ Medical Decision Making/ A&P                                 Medical Decision Making  This patient presents to the ED with chief complaint(s) of hand lac with pertinent past medical history of none which further complicates the presenting complaint. The complaint involves an extensive differential diagnosis and also carries with it a high risk of complications and morbidity.    The differential  diagnosis includes hand lac   ED Course and Reassessment: Attempted to update patient's Tdap, patient refused.  Discussed with patient's risk of not receiving Tdap and potentially getting tetanus.  Patient displayed understanding Attempted to x-ray patient's hand to evaluate for foreign body.  Patient declined hand x-ray.  Discussed with patient risks of not performing an x-ray including retaining foreign body.  Patient displayed understanding.  Patient states he would just like his hand laceration glued shut as it keeps ripping open with movement.  Consultation: - Consulted or discussed management/test interpretation w/ external professional: None  Consideration for admission or further workup: Patient stable throughout ER stay.  Patient's physical exam was reassuring patient can have follow-up care with PCP as needed.         Final Clinical Impression(s) / ED Diagnoses Final diagnoses:  Laceration of left hand, foreign body presence unspecified, initial encounter    Rx / DC Orders ED Discharge Orders     None         Dolphus Jenny, PA-C 03/15/23 1646    Vanetta Mulders, MD 03/17/23 1141

## 2023-03-15 NOTE — ED Triage Notes (Addendum)
Pt arrived via POV. Lac on L palm from metal after dismantling an [facility] unit yesterday.  Pt refuses tetanus vaccine, or needles of any kind

## 2023-07-10 ENCOUNTER — Emergency Department (HOSPITAL_BASED_OUTPATIENT_CLINIC_OR_DEPARTMENT_OTHER)

## 2023-07-10 ENCOUNTER — Encounter (HOSPITAL_BASED_OUTPATIENT_CLINIC_OR_DEPARTMENT_OTHER): Payer: Self-pay | Admitting: Emergency Medicine

## 2023-07-10 ENCOUNTER — Emergency Department (HOSPITAL_BASED_OUTPATIENT_CLINIC_OR_DEPARTMENT_OTHER)
Admission: EM | Admit: 2023-07-10 | Discharge: 2023-07-10 | Disposition: A | Discharge status: Home / Self Care | Attending: Emergency Medicine | Admitting: Emergency Medicine

## 2023-07-10 ENCOUNTER — Other Ambulatory Visit: Payer: Self-pay

## 2023-07-10 DIAGNOSIS — M25562 Pain in left knee: Secondary | ICD-10-CM | POA: Insufficient documentation

## 2023-07-10 NOTE — ED Provider Notes (Signed)
 [place] EMERGENCY DEPARTMENT AT [facility] Provider Note   CSN: [id number] Arrival date & time: 07/10/23  1322     History  Chief Complaint  Patient presents with   Knee Pain    Steven Arias is a 43 y.o. male.  Patient complains of pain in his left knee.  Patient reports that he walked 7 miles yesterday.  Patient states that he walked because he could not get a ride.  Patient states that he has chronic pain in both of his knees.  Patient states today he has pain in the front and the back of his knee.  Patient reports the pain shoots from his knee up to the left side of his head.  Patient denies any injuries he has not had any falls.  Patient reports he has not had any knee injuries in the past.   Knee Pain      Home Medications Prior to Admission medications   Medication Sig Start Date End Date Taking? Authorizing Provider  ibuprofen (ADVIL) 800 MG tablet Take 1 tablet (800 mg total) by mouth 3 (three) times daily. 11/18/21   Curatolo, Adam, DO      Allergies    Ciprofloxacin hcl and Flagyl [metronidazole hcl]    Review of Systems   Review of Systems  Musculoskeletal:  Positive for gait problem and joint swelling.  All other systems reviewed and are negative.   Physical Exam Updated Vital Signs BP (!) 169/111   Pulse (!) 110   Temp 98.2 F (36.8 C)   Resp 18   Ht 5\' 10"  (1.778 m)   Wt 95.7 kg   SpO2 98%   BMI 30.27 kg/m  Physical Exam Vitals and nursing note reviewed.  Constitutional:      Appearance: He is well-developed.  HENT:     Head: Normocephalic.  Cardiovascular:     Rate and Rhythm: Normal rate.  Pulmonary:     Effort: Pulmonary effort is normal.  Abdominal:     General: There is no distension.  Musculoskeletal:        General: Swelling and tenderness present. Normal range of motion.  Skin:    General: Skin is warm.  Neurological:     General: No focal deficit present.     Mental Status: He is alert and oriented to person, place,  and time.     ED Results / Procedures / Treatments   Labs (all labs ordered are listed, but only abnormal results are displayed) Labs Reviewed - No data to display  EKG None  Radiology DG Knee Complete 4 Views Left Result Date: 07/10/2023 CLINICAL DATA:  Pain. EXAM: LEFT KNEE - COMPLETE 4+ VIEW COMPARISON:  Radiograph 08/08/2022 FINDINGS: Normal alignment. Minor medial and lateral tibiofemoral joint space narrowing. Trace peripheral spurring. Minimal knee joint effusion. Small quadriceps tendon enthesophyte. No fracture, erosion or focal bone abnormality. IMPRESSION: Mild osteoarthritis. Electronically Signed   By: Narda Rutherford M.D.   On: 07/10/2023 15:36    Procedures Procedures    Medications Ordered in ED Medications - No data to display  ED Course/ Medical Decision Making/ A&P                                 Medical Decision Making Amount and/or Complexity of Data Reviewed Radiology: ordered and independent interpretation performed. Decision-making details documented in ED Course.    Details: X-ray left knee shows mild osteoarthritis  Risk OTC  drugs. Risk Details: Patient counseled on x-ray findings.  He asked for recommendations for over-the-counter medication I advised him to try ibuprofen or Aleve.  Patient is advised to follow-up with primary care for recheck he is discharged in stable condition.           Final Clinical Impression(s) / ED Diagnoses Final diagnoses:  Acute pain of left knee    Rx / DC Orders ED Discharge Orders     None      An After Visit Summary was printed and given to the patient.    Elson Areas, [place] 07/10/23 1713    Rondel Baton, MD 07/11/23 [id number]

## 2023-07-10 NOTE — ED Triage Notes (Signed)
 Pt via pov from home with left knee pain after walking 7 miles yesterday. Pt states he has 2 bad knees, states "I don't have any cartilage left in them." Pt states he has been hesitant to seek care due to the cost and his lack of insurance. Pt is also hesitant to receive cortisone injections if they are prescribed. Has not seen an orothopedic specialist.

## 2023-07-10 NOTE — ED Notes (Signed)
 DC by other RN-Pt verbalized understanding of d/c instructions, meds, and followup care. Denies questions. VSS, no distress noted. Steady gait to exit with all belongings.

## 2023-07-10 NOTE — Discharge Instructions (Addendum)
 Return if any problems.

## 2023-08-23 ENCOUNTER — Other Ambulatory Visit: Payer: Self-pay

## 2023-08-23 ENCOUNTER — Encounter (HOSPITAL_BASED_OUTPATIENT_CLINIC_OR_DEPARTMENT_OTHER): Payer: Self-pay | Admitting: Emergency Medicine

## 2023-08-23 ENCOUNTER — Emergency Department (HOSPITAL_BASED_OUTPATIENT_CLINIC_OR_DEPARTMENT_OTHER)
Admission: EM | Admit: 2023-08-23 | Discharge: 2023-08-23 | Disposition: A | Discharge status: Home / Self Care | Attending: Emergency Medicine | Admitting: Emergency Medicine

## 2023-08-23 DIAGNOSIS — F17200 Nicotine dependence, unspecified, uncomplicated: Secondary | ICD-10-CM | POA: Insufficient documentation

## 2023-08-23 DIAGNOSIS — M542 Cervicalgia: Secondary | ICD-10-CM | POA: Insufficient documentation

## 2023-08-23 MED ORDER — PANTOPRAZOLE SODIUM 20 MG PO TBEC: 20.0000 mg | DELAYED_RELEASE_TABLET | Freq: Every day | ORAL | 0 refills | Status: AC

## 2023-08-23 NOTE — ED Triage Notes (Signed)
 Reports "weird sensation in throat" x 3 weeks. Denies difficulty swallowing or breathing. Smoker.

## 2023-08-23 NOTE — Discharge Instructions (Signed)
 You have been evaluated for your neck discomfort.  No obvious signs of infection were noted on initial exam.  If you have persistent worsening pain to your neck with swelling to return to ER for further assessment as you may benefit from further evaluation such as a CT scan of your neck and labs to check your thyroid function.  Please avoid alcohol and tobacco use as it increased risks of cancer and can negatively affect your health.  Follow-up with your primary care doctor for further care.  Your discomfort may be due to heartburn as well, you may take Prilosec as prescribed.  You may take up to 2 weeks to notice any improvement.

## 2023-08-23 NOTE — ED Provider Notes (Signed)
 [place] [facility] AT [facility] Provider Note   CSN: [id number] Arrival date & time: 08/23/23  1437     History  No chief complaint on file.   Steven Arias is a 43 y.o. male.  The history is provided by the patient and medical records. No language interpreter was used.     43 year old male history of of polysubstance use, panic attack, chronic back pain, anxiety, presenting complaining of throat irritation.  Patient states for the past 3 weeks he noticed some discomfort primarily to the left side of his neck and throat.  States he noticed it more prominent when he coughed, or swallow.  Symptom is mild but he admits that he smokes tobacco as well as drinking alcohol.  He has noted some weight gain within the past few months.  He does not endorse any fever, chills, night sweats, hemoptysis, difficulty swallowing, chest pain or shortness of breath.  No history of thyroid disease.  No history of cancer.  Does endorse some sinus congestion and occasional sneezing from pollen.  No treatment tried.  Home Medications Prior to Admission medications   Medication Sig Start Date End Date Taking? Authorizing Provider  ibuprofen  (ADVIL ) 800 MG tablet Take 1 tablet (800 mg total) by mouth 3 (three) times daily. 11/18/21   Curatolo, Adam, DO      Allergies    Ciprofloxacin hcl and Flagyl [metronidazole hcl]    Review of Systems   Review of Systems  All other systems reviewed and are negative.   Physical Exam Updated Vital Signs There were no vitals taken for this visit. Physical Exam Constitutional:      General: He is not in acute distress.    Appearance: He is well-developed.  HENT:     Head: Atraumatic.     Nose: Nose normal. No congestion or rhinorrhea.     Mouth/Throat:     Mouth: Mucous membranes are moist.     Pharynx: Oropharynx is clear. No oropharyngeal exudate or posterior oropharyngeal erythema.  Eyes:     Conjunctiva/sclera: Conjunctivae normal.   Neck:     Vascular: No carotid bruit.  Cardiovascular:     Rate and Rhythm: Normal rate and regular rhythm.     Pulses: Normal pulses.     Heart sounds: Normal heart sounds.  Pulmonary:     Effort: Pulmonary effort is normal.     Breath sounds: Normal breath sounds.  Musculoskeletal:     Cervical back: Normal range of motion and neck supple. No rigidity or tenderness.  Lymphadenopathy:     Cervical: No cervical adenopathy.  Skin:    Findings: No rash.  Neurological:     Mental Status: He is alert and oriented to person, place, and time.     ED Results / Procedures / Treatments   Labs (all labs ordered are listed, but only abnormal results are displayed) Labs Reviewed - No data to display  EKG None  Radiology No results found.  Procedures Procedures    Medications Ordered in ED Medications - No data to display  ED Course/ Medical Decision Making/ A&P                                 Medical Decision Making  BP (!) 148/97 (BP Location: Right Arm)   Pulse 92   Temp 98.2 F (36.8 C) (Oral)   Resp 18   Ht 5' 10 (1.778 m)  Wt 99.8 kg   SpO2 100%   BMI 31.57 kg/m   37:76 PM  43 year old male history of of polysubstance use, panic attack, chronic back pain, anxiety, presenting complaining of throat irritation.  Patient states for the past 3 weeks he noticed some discomfort primarily to the left side of his neck and throat.  States he noticed it more prominent when he coughed, or swallow.  Symptom is mild but he admits that he smokes tobacco as well as drinking alcohol.  He has noted some weight gain within the past few months.  He does not endorse any fever, chills, night sweats, hemoptysis, difficulty swallowing, chest pain or shortness of breath.  No history of thyroid disease.  No history of cancer.  Does endorse some sinus congestion and occasional sneezing from pollen.  No treatment tried.  On exam, patient is sitting comfortably in the chair appears to be in  no acute discomfort.  He has normal phonation.  Throat exam unremarkable, neck exam unremarkable, trachea is midline no tonsillar enlargement no thyromegaly no enlarged lymph nodes and no nuchal rigidity.  No pulsatile mass.  Vital sign overall reassuring.  Initial exam fairly reassuring.  I discussed with patient options of evaluation such as basic labs, TSH, neck CT soft tissue to assess for potential mass versus abscess versus enlarged thyroid however suspicion for acute emergent airway compromise or medical condition is low.  Patient states he felt a bit more reassured.  He opted for no further testing at this time and follow-up with PCP for outpatient care.  I gave patient return precaution.  DDx: GERD, pharyngitis, mono, strep, tonsillitis, thyroid disease, retropharyngeal abscess, Lemierre's, malignancy, reactive lymph node  Agree with patient, no further workup at this time.  Outpatient follow-up.  Recommend tobacco and alcohol cessation as it may increase risk of cancer.  I gave patient strict return precaution and he was understanding agrees to plan.        Final Clinical Impression(s) / ED Diagnoses Final diagnoses:  Neck pain on left side    Rx / DC Orders ED Discharge Orders          Ordered    pantoprazole  (PROTONIX ) 20 MG tablet  Daily        08/23/23 1511              Nivia Colon, PA-C 08/23/23 1513    Yolande Lamar BROCKS, MD 08/23/23 [phone]
